# Patient Record
Sex: Female | Born: 1945 | Race: Black or African American | Hispanic: No | Smoking: Current some day smoker
Health system: Southern US, Community
[De-identification: ages and names within clinical notes are randomized; demographics above are authoritative.]

## PROBLEM LIST (undated history)

## (undated) DIAGNOSIS — I10 Essential (primary) hypertension: Secondary | ICD-10-CM

## (undated) DIAGNOSIS — K621 Rectal polyp: Secondary | ICD-10-CM

## (undated) DIAGNOSIS — K635 Polyp of colon: Secondary | ICD-10-CM

## (undated) DIAGNOSIS — E039 Hypothyroidism, unspecified: Secondary | ICD-10-CM

## (undated) HISTORY — PX: OTHER SURGICAL HISTORY: SHX169

## (undated) HISTORY — PX: LUNG SURGERY: SHX703

## (undated) SURGERY — COLONOSCOPY
Anesthesia: Moderate Sedation

---

## 1998-07-30 ENCOUNTER — Encounter: Admission: RE | Admit: 1998-07-30 | Discharge: 1998-07-30 | Payer: Self-pay | Admitting: Family Medicine

## 1998-08-29 ENCOUNTER — Encounter: Admission: RE | Admit: 1998-08-29 | Discharge: 1998-08-29 | Payer: Self-pay | Admitting: Family Medicine

## 1999-11-27 ENCOUNTER — Encounter: Admission: RE | Admit: 1999-11-27 | Discharge: 1999-11-27 | Payer: Self-pay | Admitting: Sports Medicine

## 2000-01-04 ENCOUNTER — Encounter: Admission: RE | Admit: 2000-01-04 | Discharge: 2000-01-04 | Payer: Self-pay | Admitting: *Deleted

## 2000-01-04 ENCOUNTER — Encounter: Payer: Self-pay | Admitting: Sports Medicine

## 2000-04-01 ENCOUNTER — Emergency Department (HOSPITAL_COMMUNITY): Admission: EM | Admit: 2000-04-01 | Discharge: 2000-04-01 | Payer: Self-pay | Admitting: Emergency Medicine

## 2000-05-08 ENCOUNTER — Emergency Department (HOSPITAL_COMMUNITY): Admission: EM | Admit: 2000-05-08 | Discharge: 2000-05-08 | Payer: Self-pay | Admitting: Emergency Medicine

## 2000-05-08 ENCOUNTER — Encounter: Payer: Self-pay | Admitting: Emergency Medicine

## 2000-05-09 ENCOUNTER — Encounter: Payer: Self-pay | Admitting: Emergency Medicine

## 2000-05-12 ENCOUNTER — Encounter (INDEPENDENT_AMBULATORY_CARE_PROVIDER_SITE_OTHER): Payer: Self-pay | Admitting: *Deleted

## 2000-05-12 ENCOUNTER — Ambulatory Visit (HOSPITAL_COMMUNITY): Admission: RE | Admit: 2000-05-12 | Discharge: 2000-05-13 | Payer: Self-pay | Admitting: General Surgery

## 2000-05-12 ENCOUNTER — Encounter: Payer: Self-pay | Admitting: General Surgery

## 2000-08-10 ENCOUNTER — Encounter: Admission: RE | Admit: 2000-08-10 | Discharge: 2000-08-10 | Payer: Self-pay | Admitting: Family Medicine

## 2001-01-25 ENCOUNTER — Encounter: Admission: RE | Admit: 2001-01-25 | Discharge: 2001-01-25 | Payer: Self-pay | Admitting: Family Medicine

## 2001-02-01 ENCOUNTER — Encounter: Admission: RE | Admit: 2001-02-01 | Discharge: 2001-02-01 | Payer: Self-pay | Admitting: Sports Medicine

## 2001-02-01 ENCOUNTER — Encounter: Payer: Self-pay | Admitting: Sports Medicine

## 2001-02-23 ENCOUNTER — Encounter: Admission: RE | Admit: 2001-02-23 | Discharge: 2001-02-23 | Payer: Self-pay | Admitting: Family Medicine

## 2001-03-02 ENCOUNTER — Encounter: Admission: RE | Admit: 2001-03-02 | Discharge: 2001-03-02 | Payer: Self-pay | Admitting: Otolaryngology

## 2001-03-02 ENCOUNTER — Encounter: Payer: Self-pay | Admitting: Otolaryngology

## 2001-03-24 ENCOUNTER — Encounter (INDEPENDENT_AMBULATORY_CARE_PROVIDER_SITE_OTHER): Payer: Self-pay | Admitting: *Deleted

## 2001-03-24 ENCOUNTER — Ambulatory Visit (HOSPITAL_BASED_OUTPATIENT_CLINIC_OR_DEPARTMENT_OTHER): Admission: RE | Admit: 2001-03-24 | Discharge: 2001-03-25 | Payer: Self-pay | Admitting: Otolaryngology

## 2001-06-07 ENCOUNTER — Ambulatory Visit (HOSPITAL_COMMUNITY): Admission: RE | Admit: 2001-06-07 | Discharge: 2001-06-07 | Payer: Self-pay | Admitting: Gastroenterology

## 2001-08-30 ENCOUNTER — Encounter: Admission: RE | Admit: 2001-08-30 | Discharge: 2001-08-30 | Payer: Self-pay | Admitting: Family Medicine

## 2001-09-06 ENCOUNTER — Encounter: Payer: Self-pay | Admitting: Sports Medicine

## 2001-09-06 ENCOUNTER — Encounter: Admission: RE | Admit: 2001-09-06 | Discharge: 2001-09-06 | Payer: Self-pay | Admitting: Sports Medicine

## 2001-09-29 ENCOUNTER — Encounter: Admission: RE | Admit: 2001-09-29 | Discharge: 2001-09-29 | Payer: Self-pay | Admitting: Family Medicine

## 2002-03-08 ENCOUNTER — Encounter: Admission: RE | Admit: 2002-03-08 | Discharge: 2002-03-08 | Payer: Self-pay | Admitting: Family Medicine

## 2002-03-20 ENCOUNTER — Ambulatory Visit (HOSPITAL_COMMUNITY): Admission: RE | Admit: 2002-03-20 | Discharge: 2002-03-20 | Payer: Self-pay | Admitting: Gastroenterology

## 2002-04-23 ENCOUNTER — Encounter: Admission: RE | Admit: 2002-04-23 | Discharge: 2002-04-23 | Payer: Self-pay | Admitting: Family Medicine

## 2002-05-02 ENCOUNTER — Encounter: Payer: Self-pay | Admitting: Sports Medicine

## 2002-05-02 ENCOUNTER — Encounter: Admission: RE | Admit: 2002-05-02 | Discharge: 2002-05-02 | Payer: Self-pay | Admitting: Sports Medicine

## 2002-08-13 ENCOUNTER — Encounter: Admission: RE | Admit: 2002-08-13 | Discharge: 2002-08-13 | Payer: Self-pay | Admitting: Family Medicine

## 2002-12-06 ENCOUNTER — Encounter (INDEPENDENT_AMBULATORY_CARE_PROVIDER_SITE_OTHER): Payer: Self-pay | Admitting: *Deleted

## 2002-12-18 ENCOUNTER — Encounter: Admission: RE | Admit: 2002-12-18 | Discharge: 2002-12-18 | Payer: Self-pay | Admitting: Family Medicine

## 2003-01-22 ENCOUNTER — Encounter: Admission: RE | Admit: 2003-01-22 | Discharge: 2003-01-22 | Payer: Self-pay | Admitting: Sports Medicine

## 2003-03-01 ENCOUNTER — Encounter: Payer: Self-pay | Admitting: Occupational Medicine

## 2003-03-01 ENCOUNTER — Encounter: Admission: RE | Admit: 2003-03-01 | Discharge: 2003-03-01 | Payer: Self-pay | Admitting: Occupational Medicine

## 2003-05-13 ENCOUNTER — Encounter: Admission: RE | Admit: 2003-05-13 | Discharge: 2003-05-13 | Payer: Self-pay | Admitting: Sports Medicine

## 2003-06-17 ENCOUNTER — Ambulatory Visit (HOSPITAL_COMMUNITY): Admission: RE | Admit: 2003-06-17 | Discharge: 2003-06-17 | Payer: Self-pay | Admitting: Family Medicine

## 2004-06-17 ENCOUNTER — Ambulatory Visit (HOSPITAL_COMMUNITY): Admission: RE | Admit: 2004-06-17 | Discharge: 2004-06-17 | Payer: Self-pay | Admitting: Gynecology

## 2004-08-03 ENCOUNTER — Other Ambulatory Visit: Admission: RE | Admit: 2004-08-03 | Discharge: 2004-08-03 | Payer: Self-pay | Admitting: Gynecology

## 2004-11-06 ENCOUNTER — Emergency Department (HOSPITAL_COMMUNITY): Admission: EM | Admit: 2004-11-06 | Discharge: 2004-11-06 | Payer: Self-pay | Admitting: Emergency Medicine

## 2004-11-16 ENCOUNTER — Ambulatory Visit: Payer: Self-pay | Admitting: Family Medicine

## 2005-02-04 ENCOUNTER — Encounter: Admission: RE | Admit: 2005-02-04 | Discharge: 2005-02-04 | Payer: Self-pay | Admitting: Sports Medicine

## 2005-02-04 ENCOUNTER — Ambulatory Visit: Payer: Self-pay | Admitting: Family Medicine

## 2005-03-08 ENCOUNTER — Emergency Department (HOSPITAL_COMMUNITY): Admission: EM | Admit: 2005-03-08 | Discharge: 2005-03-08 | Payer: Self-pay | Admitting: Emergency Medicine

## 2005-03-11 ENCOUNTER — Ambulatory Visit: Payer: Self-pay | Admitting: Sports Medicine

## 2005-05-26 ENCOUNTER — Ambulatory Visit: Payer: Self-pay | Admitting: Family Medicine

## 2005-06-07 ENCOUNTER — Ambulatory Visit: Payer: Self-pay | Admitting: Family Medicine

## 2005-07-05 ENCOUNTER — Ambulatory Visit: Payer: Self-pay | Admitting: Family Medicine

## 2005-07-19 ENCOUNTER — Other Ambulatory Visit: Admission: RE | Admit: 2005-07-19 | Discharge: 2005-07-19 | Payer: Self-pay | Admitting: Gynecology

## 2005-07-19 ENCOUNTER — Ambulatory Visit (HOSPITAL_COMMUNITY): Admission: RE | Admit: 2005-07-19 | Discharge: 2005-07-19 | Payer: Self-pay | Admitting: Women's Health

## 2005-08-30 ENCOUNTER — Encounter: Admission: RE | Admit: 2005-08-30 | Discharge: 2005-08-30 | Payer: Self-pay | Admitting: Gynecology

## 2005-12-31 ENCOUNTER — Ambulatory Visit: Payer: Self-pay | Admitting: Sports Medicine

## 2006-02-10 ENCOUNTER — Emergency Department (HOSPITAL_COMMUNITY): Admission: EM | Admit: 2006-02-10 | Discharge: 2006-02-10 | Payer: Self-pay | Admitting: Family Medicine

## 2006-05-13 ENCOUNTER — Emergency Department (HOSPITAL_COMMUNITY): Admission: EM | Admit: 2006-05-13 | Discharge: 2006-05-13 | Payer: Self-pay | Admitting: Emergency Medicine

## 2007-02-02 DIAGNOSIS — F17200 Nicotine dependence, unspecified, uncomplicated: Secondary | ICD-10-CM

## 2007-02-02 DIAGNOSIS — J4489 Other specified chronic obstructive pulmonary disease: Secondary | ICD-10-CM | POA: Insufficient documentation

## 2007-02-02 DIAGNOSIS — J449 Chronic obstructive pulmonary disease, unspecified: Secondary | ICD-10-CM

## 2007-02-02 DIAGNOSIS — H269 Unspecified cataract: Secondary | ICD-10-CM

## 2007-02-02 DIAGNOSIS — N951 Menopausal and female climacteric states: Secondary | ICD-10-CM

## 2007-02-02 DIAGNOSIS — G43909 Migraine, unspecified, not intractable, without status migrainosus: Secondary | ICD-10-CM | POA: Insufficient documentation

## 2007-02-02 DIAGNOSIS — E039 Hypothyroidism, unspecified: Secondary | ICD-10-CM

## 2007-02-02 DIAGNOSIS — H409 Unspecified glaucoma: Secondary | ICD-10-CM | POA: Insufficient documentation

## 2007-02-02 DIAGNOSIS — N393 Stress incontinence (female) (male): Secondary | ICD-10-CM | POA: Insufficient documentation

## 2007-02-02 DIAGNOSIS — K921 Melena: Secondary | ICD-10-CM | POA: Insufficient documentation

## 2007-02-03 ENCOUNTER — Encounter (INDEPENDENT_AMBULATORY_CARE_PROVIDER_SITE_OTHER): Payer: Self-pay | Admitting: *Deleted

## 2007-07-31 ENCOUNTER — Ambulatory Visit: Payer: Self-pay | Admitting: Internal Medicine

## 2007-07-31 ENCOUNTER — Encounter (INDEPENDENT_AMBULATORY_CARE_PROVIDER_SITE_OTHER): Payer: Self-pay | Admitting: Nurse Practitioner

## 2007-07-31 ENCOUNTER — Ambulatory Visit: Payer: Self-pay | Admitting: *Deleted

## 2007-07-31 DIAGNOSIS — M7150 Other bursitis, not elsewhere classified, unspecified site: Secondary | ICD-10-CM

## 2007-07-31 DIAGNOSIS — J309 Allergic rhinitis, unspecified: Secondary | ICD-10-CM | POA: Insufficient documentation

## 2007-08-03 ENCOUNTER — Ambulatory Visit (HOSPITAL_COMMUNITY): Admission: RE | Admit: 2007-08-03 | Discharge: 2007-08-03 | Payer: Self-pay | Admitting: Internal Medicine

## 2007-08-08 ENCOUNTER — Encounter (INDEPENDENT_AMBULATORY_CARE_PROVIDER_SITE_OTHER): Payer: Self-pay | Admitting: Nurse Practitioner

## 2007-08-08 ENCOUNTER — Encounter: Payer: Self-pay | Admitting: Family Medicine

## 2007-08-08 ENCOUNTER — Ambulatory Visit: Payer: Self-pay | Admitting: Internal Medicine

## 2007-08-08 LAB — CONVERTED CEMR LAB
AST: 12 units/L (ref 0–37)
Alkaline Phosphatase: 114 units/L (ref 39–117)
BUN: 12 mg/dL (ref 6–23)
Basophils Absolute: 0 10*3/uL (ref 0.0–0.1)
Calcium: 9.2 mg/dL (ref 8.4–10.5)
Eosinophils Relative: 4 % (ref 0–5)
Glucose, Urine, Semiquant: NEGATIVE
HCT: 39 % (ref 36.0–46.0)
Lymphocytes Relative: 37 % (ref 12–46)
MCHC: 31.8 g/dL (ref 30.0–36.0)
MCV: 88.6 fL (ref 78.0–100.0)
Monocytes Absolute: 0.7 10*3/uL (ref 0.2–0.7)
Monocytes Relative: 9 % (ref 3–11)
Neutrophils Relative %: 50 % (ref 43–77)
Nitrite: NEGATIVE
Platelets: 308 10*3/uL (ref 150–400)
Protein, U semiquant: NEGATIVE
RBC: 4.4 M/uL (ref 3.87–5.11)
RDW: 15.8 % — ABNORMAL HIGH (ref 11.5–14.0)
Total Bilirubin: 0.3 mg/dL (ref 0.3–1.2)
Total CHOL/HDL Ratio: 5.3
VLDL: 65 mg/dL — ABNORMAL HIGH (ref 0–40)
WBC: 7.5 10*3/uL (ref 4.0–10.5)
pH: 6.5

## 2007-08-10 ENCOUNTER — Encounter: Payer: Self-pay | Admitting: Family Medicine

## 2007-08-15 ENCOUNTER — Ambulatory Visit: Payer: Self-pay | Admitting: Family Medicine

## 2007-08-15 DIAGNOSIS — M19019 Primary osteoarthritis, unspecified shoulder: Secondary | ICD-10-CM | POA: Insufficient documentation

## 2007-09-28 ENCOUNTER — Ambulatory Visit: Payer: Self-pay | Admitting: Family Medicine

## 2007-09-28 LAB — CONVERTED CEMR LAB: LDL Goal: 130 mg/dL

## 2007-11-14 ENCOUNTER — Ambulatory Visit: Payer: Self-pay | Admitting: Family Medicine

## 2007-11-14 LAB — CONVERTED CEMR LAB
Glucose, Urine, Semiquant: NEGATIVE
Ketones, urine, test strip: NEGATIVE
Nitrite: NEGATIVE
Protein, U semiquant: NEGATIVE
Specific Gravity, Urine: 1.02
Urobilinogen, UA: NEGATIVE
WBC Urine, dipstick: NEGATIVE

## 2007-11-23 ENCOUNTER — Ambulatory Visit (HOSPITAL_COMMUNITY): Admission: RE | Admit: 2007-11-23 | Discharge: 2007-11-23 | Payer: Self-pay | Admitting: Family Medicine

## 2007-12-27 ENCOUNTER — Encounter (INDEPENDENT_AMBULATORY_CARE_PROVIDER_SITE_OTHER): Payer: Self-pay | Admitting: Nurse Practitioner

## 2008-01-30 ENCOUNTER — Ambulatory Visit: Payer: Self-pay | Admitting: Family Medicine

## 2008-01-30 DIAGNOSIS — I10 Essential (primary) hypertension: Secondary | ICD-10-CM | POA: Insufficient documentation

## 2008-01-30 DIAGNOSIS — M1710 Unilateral primary osteoarthritis, unspecified knee: Secondary | ICD-10-CM

## 2008-05-30 ENCOUNTER — Telehealth (INDEPENDENT_AMBULATORY_CARE_PROVIDER_SITE_OTHER): Payer: Self-pay | Admitting: Nurse Practitioner

## 2008-07-29 ENCOUNTER — Ambulatory Visit: Payer: Self-pay | Admitting: Family Medicine

## 2008-08-18 ENCOUNTER — Emergency Department (HOSPITAL_COMMUNITY): Admission: EM | Admit: 2008-08-18 | Discharge: 2008-08-18 | Payer: Self-pay | Admitting: Emergency Medicine

## 2008-11-27 ENCOUNTER — Telehealth (INDEPENDENT_AMBULATORY_CARE_PROVIDER_SITE_OTHER): Payer: Self-pay | Admitting: Nurse Practitioner

## 2008-11-27 ENCOUNTER — Emergency Department (HOSPITAL_COMMUNITY): Admission: EM | Admit: 2008-11-27 | Discharge: 2008-11-27 | Payer: Self-pay | Admitting: Emergency Medicine

## 2009-01-02 ENCOUNTER — Ambulatory Visit (HOSPITAL_COMMUNITY): Admission: RE | Admit: 2009-01-02 | Discharge: 2009-01-02 | Payer: Self-pay | Admitting: Family Medicine

## 2009-01-09 ENCOUNTER — Encounter: Admission: RE | Admit: 2009-01-09 | Discharge: 2009-01-09 | Payer: Self-pay | Admitting: Family Medicine

## 2009-01-10 ENCOUNTER — Encounter (INDEPENDENT_AMBULATORY_CARE_PROVIDER_SITE_OTHER): Payer: Self-pay | Admitting: Family Medicine

## 2009-01-22 ENCOUNTER — Ambulatory Visit: Payer: Self-pay | Admitting: Family Medicine

## 2009-01-22 DIAGNOSIS — Z8719 Personal history of other diseases of the digestive system: Secondary | ICD-10-CM

## 2009-01-31 ENCOUNTER — Ambulatory Visit: Payer: Self-pay | Admitting: Family Medicine

## 2009-01-31 LAB — CONVERTED CEMR LAB
ALT: 10 units/L (ref 0–35)
AST: 14 units/L (ref 0–37)
Alkaline Phosphatase: 86 units/L (ref 39–117)
Basophils Absolute: 0 10*3/uL (ref 0.0–0.1)
Basophils Relative: 0 % (ref 0–1)
CO2: 29 meq/L (ref 19–32)
Creatinine, Ser: 0.94 mg/dL (ref 0.40–1.20)
Eosinophils Relative: 2 % (ref 0–5)
Glucose, Bld: 87 mg/dL (ref 70–99)
Hemoglobin: 11.9 g/dL — ABNORMAL LOW (ref 12.0–15.0)
MCHC: 31.5 g/dL (ref 30.0–36.0)
Neutro Abs: 2.6 10*3/uL (ref 1.7–7.7)
Neutrophils Relative %: 43 % (ref 43–77)
Potassium: 3.7 meq/L (ref 3.5–5.3)
RBC: 4.34 M/uL (ref 3.87–5.11)
Sodium: 138 meq/L (ref 135–145)
TSH: 4.587 microintl units/mL — ABNORMAL HIGH (ref 0.350–4.50)
Total CHOL/HDL Ratio: 3.9
WBC: 6 10*3/uL (ref 4.0–10.5)

## 2009-04-01 ENCOUNTER — Ambulatory Visit: Payer: Self-pay | Admitting: Family Medicine

## 2009-04-02 ENCOUNTER — Encounter (INDEPENDENT_AMBULATORY_CARE_PROVIDER_SITE_OTHER): Payer: Self-pay | Admitting: Family Medicine

## 2009-04-28 ENCOUNTER — Ambulatory Visit: Payer: Self-pay | Admitting: Family Medicine

## 2009-04-28 DIAGNOSIS — M62838 Other muscle spasm: Secondary | ICD-10-CM

## 2009-05-14 ENCOUNTER — Encounter: Admission: RE | Admit: 2009-05-14 | Discharge: 2009-06-30 | Payer: Self-pay | Admitting: Family Medicine

## 2009-06-05 ENCOUNTER — Encounter (INDEPENDENT_AMBULATORY_CARE_PROVIDER_SITE_OTHER): Payer: Self-pay | Admitting: Internal Medicine

## 2009-06-13 ENCOUNTER — Encounter (INDEPENDENT_AMBULATORY_CARE_PROVIDER_SITE_OTHER): Payer: Self-pay | Admitting: Nurse Practitioner

## 2009-06-16 ENCOUNTER — Ambulatory Visit: Payer: Self-pay | Admitting: Physician Assistant

## 2009-06-16 DIAGNOSIS — M25519 Pain in unspecified shoulder: Secondary | ICD-10-CM

## 2009-06-25 ENCOUNTER — Ambulatory Visit (HOSPITAL_COMMUNITY): Admission: RE | Admit: 2009-06-25 | Discharge: 2009-06-25 | Payer: Self-pay | Admitting: Physician Assistant

## 2009-07-03 ENCOUNTER — Telehealth: Payer: Self-pay | Admitting: Physician Assistant

## 2009-07-04 ENCOUNTER — Ambulatory Visit: Payer: Self-pay | Admitting: Physician Assistant

## 2009-10-03 ENCOUNTER — Ambulatory Visit: Payer: Self-pay | Admitting: Physician Assistant

## 2009-10-03 DIAGNOSIS — B373 Candidiasis of vulva and vagina: Secondary | ICD-10-CM | POA: Insufficient documentation

## 2009-10-03 DIAGNOSIS — A5901 Trichomonal vulvovaginitis: Secondary | ICD-10-CM | POA: Insufficient documentation

## 2009-10-06 ENCOUNTER — Encounter: Payer: Self-pay | Admitting: Physician Assistant

## 2009-10-06 DIAGNOSIS — E559 Vitamin D deficiency, unspecified: Secondary | ICD-10-CM | POA: Insufficient documentation

## 2009-10-09 ENCOUNTER — Encounter: Payer: Self-pay | Admitting: Physician Assistant

## 2009-10-09 LAB — CONVERTED CEMR LAB
AST: 19 units/L (ref 0–37)
Alkaline Phosphatase: 89 units/L (ref 39–117)
CO2: 15 meq/L — ABNORMAL LOW (ref 19–32)
Calcium: 9.1 mg/dL (ref 8.4–10.5)
Chloride: 101 meq/L (ref 96–112)
Creatinine, Ser: 0.98 mg/dL (ref 0.40–1.20)
Glucose, Bld: 93 mg/dL (ref 70–99)
HDL: 57 mg/dL (ref >39)
Potassium: 4.1 meq/L (ref 3.5–5.3)
Sodium: 142 meq/L (ref 135–145)
Total Bilirubin: 0.4 mg/dL (ref 0.3–1.2)
VLDL: 27 mg/dL (ref 0–40)

## 2009-11-03 ENCOUNTER — Encounter: Payer: Self-pay | Admitting: Physician Assistant

## 2010-01-01 ENCOUNTER — Telehealth: Payer: Self-pay | Admitting: Physician Assistant

## 2010-01-22 ENCOUNTER — Ambulatory Visit (HOSPITAL_COMMUNITY): Admission: RE | Admit: 2010-01-22 | Discharge: 2010-01-22 | Payer: Self-pay | Admitting: Internal Medicine

## 2010-02-10 ENCOUNTER — Telehealth: Payer: Self-pay | Admitting: Physician Assistant

## 2010-02-17 ENCOUNTER — Ambulatory Visit: Payer: Self-pay | Admitting: Physician Assistant

## 2010-02-18 ENCOUNTER — Encounter: Payer: Self-pay | Admitting: Physician Assistant

## 2010-02-19 LAB — CONVERTED CEMR LAB
ALT: 13 units/L (ref 0–35)
AST: 19 units/L (ref 0–37)
BUN: 16 mg/dL (ref 6–23)
Chloride: 98 meq/L (ref 96–112)
Creatinine, Ser: 1.02 mg/dL (ref 0.40–1.20)
Glucose, Bld: 91 mg/dL (ref 70–99)
Sodium: 140 meq/L (ref 135–145)
TSH: 12.949 microintl units/mL — ABNORMAL HIGH (ref 0.350–4.500)
Total Bilirubin: 0.3 mg/dL (ref 0.3–1.2)

## 2010-02-24 ENCOUNTER — Telehealth: Payer: Self-pay | Admitting: Physician Assistant

## 2010-04-28 ENCOUNTER — Ambulatory Visit: Payer: Self-pay | Admitting: Physician Assistant

## 2010-04-28 ENCOUNTER — Telehealth: Payer: Self-pay | Admitting: Physician Assistant

## 2010-04-28 LAB — CONVERTED CEMR LAB: TSH: 3.654 microintl units/mL (ref 0.350–4.500)

## 2010-04-29 ENCOUNTER — Encounter: Payer: Self-pay | Admitting: Physician Assistant

## 2010-07-16 ENCOUNTER — Telehealth: Payer: Self-pay | Admitting: Physician Assistant

## 2010-07-23 ENCOUNTER — Ambulatory Visit: Payer: Self-pay | Admitting: Physician Assistant

## 2010-07-24 ENCOUNTER — Encounter (INDEPENDENT_AMBULATORY_CARE_PROVIDER_SITE_OTHER): Payer: Self-pay | Admitting: *Deleted

## 2010-07-26 ENCOUNTER — Telehealth: Payer: Self-pay | Admitting: Physician Assistant

## 2010-07-27 ENCOUNTER — Telehealth: Payer: Self-pay | Admitting: Physician Assistant

## 2010-07-27 LAB — CONVERTED CEMR LAB
ALT: 11 units/L (ref 0–35)
Cholesterol: 246 mg/dL — ABNORMAL HIGH (ref 0–200)
LDL Cholesterol: 173 mg/dL — ABNORMAL HIGH (ref 0–99)
TSH: 4.053 microintl units/mL (ref 0.350–4.500)
Total Bilirubin: 0.5 mg/dL (ref 0.3–1.2)
Total Protein: 7.3 g/dL (ref 6.0–8.3)
Triglycerides: 99 mg/dL (ref <150)
VLDL: 20 mg/dL (ref 0–40)

## 2010-07-29 ENCOUNTER — Telehealth: Payer: Self-pay | Admitting: Physician Assistant

## 2010-10-27 ENCOUNTER — Encounter (INDEPENDENT_AMBULATORY_CARE_PROVIDER_SITE_OTHER): Payer: Self-pay | Admitting: Internal Medicine

## 2010-10-27 ENCOUNTER — Ambulatory Visit: Payer: Self-pay | Admitting: Physician Assistant

## 2010-10-27 LAB — CONVERTED CEMR LAB
AST: 15 units/L (ref 0–37)
Albumin: 4.4 g/dL (ref 3.5–5.2)
HDL: 53 mg/dL (ref >39)
Indirect Bilirubin: 0.2 mg/dL (ref 0.0–0.9)
Total CHOL/HDL Ratio: 3.7
Triglycerides: 120 mg/dL (ref <150)
VLDL: 24 mg/dL (ref 0–40)

## 2010-11-10 ENCOUNTER — Ambulatory Visit: Payer: Self-pay | Admitting: Internal Medicine

## 2010-11-10 DIAGNOSIS — E785 Hyperlipidemia, unspecified: Secondary | ICD-10-CM

## 2010-12-06 HISTORY — PX: BREAST BIOPSY: SHX20

## 2010-12-27 ENCOUNTER — Encounter: Payer: Self-pay | Admitting: Internal Medicine

## 2010-12-27 ENCOUNTER — Encounter: Payer: Self-pay | Admitting: Family Medicine

## 2011-01-05 NOTE — Progress Notes (Signed)
Summary: GI referral  Phone Note Outgoing Call   Summary of Call: Please refer to GI for screening colo. Initial call taken by: Brynda Rim,  Apr 28, 2010 4:27 PM

## 2011-01-05 NOTE — Progress Notes (Signed)
  Phone Note Outgoing Call   Reason for Call: Discuss billing issue Summary of Call: Getting refill requests for Climara patch. When I last saw her, we decided to taper her off the hormone patch.  She should have been off this medicine by now. Please check with patient. Initial call taken by: Brynda Rim,  January 01, 2010 8:35 AM  Follow-up for Phone Call        Left message on answering machine for pt to call back...[name] KitchenMarland KitchenArmenia Shannon  January 01, 2010 9:23 AM   spoke with pt and she said she is on her last one for the month of Jan.....[name] Kitchen pt says she did not ask for a refill from pharmacy.... [name]  January 05, 2010 10:08 AM    Additional Follow-up for Phone Call Additional follow up Details #1::        Notified pharmacy. Additional Follow-up by: Tereso Newcomer PA-C,  January 05, 2010 10:52 AM

## 2011-01-05 NOTE — Progress Notes (Signed)
Summary: Lab f/u  Phone Note Outgoing Call   Summary of Call: Cholesterol is very high. Is she taking the pravastatin?  Thyroid level ok.  Continue same dose of synthroid.  Initial call taken by: Brynda Rim,  July 27, 2010 10:44 PM  Follow-up for Phone Call        Left message with female for Patient to return call. Gaylyn Cheers RN  July 28, 2010 10:31 AM   Pt. made aware of lab results -- states she is out of pravastatin, so she has not been taking it. Dutch Quint RN  July 29, 2010 11:59 AM   Additional Follow-up for Phone Call Additional follow up Details #1::        Restart pravastatin.  Do not stop taking. Rx in basket to fax to her pharmacy. Check FLP and LFTs in 3 months.  Additional Follow-up by: Tereso Newcomer PA-C,  July 30, 2010 5:34 PM    Additional Follow-up for Phone Call Additional follow up Details #2::    Left message on answering machine for pt. to return call.  Dutch Quint RN  July 31, 2010 9:36 AM  Left message with grandson for pt. to return call.  Dutch Quint RN  August 03, 2010 9:53 AM  Advised pt. of provider's instructions and refill.  Lab appt. 10/27/10. Dutch Quint RN  August 03, 2010 12:17 PM   Prescriptions: PRAVASTATIN SODIUM 20 MG TABS (PRAVASTATIN SODIUM) Take 1 tab by mouth at bedtime  #30 x 3   Entered and Authorized by:   Tereso Newcomer PA-C   Signed by:   Tereso Newcomer PA-C on 07/30/2010   Method used:   Printed then faxed to ...       [facility] - [facility] (retail)       [address] [place], [place]  [address]       Ph: [phone]       Fax: [phone]   RxID:   [id number]

## 2011-01-05 NOTE — Letter (Signed)
Summary: *HSN Results Follow up  [facility]  [address] [place], [place] [address]   Phone: [phone]  Fax: [phone]      04/29/2010   CIEARRA B Sanzo [address] [place], [place]  [address]   Dear  Ms. Pattricia Boss Mulhearn,                            ____S.Drinkard,FNP   ____D. Gore,FNP       ____B. McPherson,MD   ____V. Rankins,MD    ____E. Mulberry,MD    ____N. Daphine Deutscher, FNP  ____D. Reche Dixon, MD    ____K. Philipp Deputy, MD    __x__S. Alben Spittle, PA-C     This letter is to inform you that your recent test(s):  _______Pap Smear    ____x___Lab Test     _______X-ray    ____x___ is within acceptable limits  _______ requires a medication change  _______ requires a follow-up lab visit  _______ requires a follow-up visit with your provider   Comments: Thyroid levels are now normal.  Keep taking the Synthroid (Levothyroxine).       _________________________________________________________ If you have any questions, please contact our office                     Sincerely,  Tereso Newcomer PA-C [facility]

## 2011-01-05 NOTE — Progress Notes (Signed)
  Phone Note Outgoing Call   Summary of Call: Needs FLP and LFTs and TSH.   Initial call taken by: Brynda Rim,  July 16, 2010 1:00 PM  Follow-up for Phone Call        PT IS AWARE AND HAS APPT Follow-up by: [name],  July 17, 2010 9:56 AM    Prescriptions: SYNTHROID 25 MCG TABS (LEVOTHYROXINE SODIUM) Take 1 tablet by mouth once a day  #30 x 3   Entered and Authorized by:   Tereso Newcomer PA-C   Signed by:   Tereso Newcomer PA-C on 07/16/2010   Method used:   Faxed to ...       [facility] - [facility] (retail)       [address] [place], [place]  [address]       Ph: [phone]       Fax: [phone]   RxID:   [id number] EFFEXOR XR 75 MG XR24H-CAP (VENLAFAXINE HCL) Take 1 tab by mouth at bedtime  #30 x 6   Entered and Authorized by:   Tereso Newcomer PA-C   Signed by:   Tereso Newcomer PA-C on 07/16/2010   Method used:   Faxed to ...       [facility] - [facility] (retail)       [address] [place], [place]  [address]       Ph: [phone]       Fax: [phone]   RxID:   [id number] PRAVASTATIN SODIUM 20 MG TABS (PRAVASTATIN SODIUM) Take 1 tab by mouth at bedtime  #30 x 3   Entered and Authorized by:   Tereso Newcomer PA-C   Signed by:   Tereso Newcomer PA-C on 07/16/2010   Method used:   Faxed to ...       [facility] - [facility] (retail)       [address] [place], [place]  [address]       Ph: [phone]       Fax: [phone]   RxID:   [id number]

## 2011-01-05 NOTE — Assessment & Plan Note (Signed)
Summary: HOT FLASHES/NIGHT SWEATS///KT   Vital Signs:  Patient profile:   65 year old female Height:      68.5 inches Weight:      160 pounds BMI:     24.06 Temp:     97.4 degrees F oral Pulse rate:   74 / minute Pulse rhythm:   regular Resp:     18 per minute BP sitting:   115 / 75  (left arm) Cuff size:   regular  Vitals Entered By: [name] (February 17, 2010 9:52 AM) CC: Kelli Sandoval says she has been having hot flashes after she stop taking the climara patches.... Kelli Sandoval says she has hot flashes in her legs and back..... Is Patient Diabetic? No Pain Assessment Patient in pain? no       Does patient need assistance? Functional Status Self care Ambulation Normal   Primary Care Provider:  Tereso Newcomer PA-C  CC:  Kelli Sandoval says she has been having hot flashes after she stop taking the climara patches.... Kelli Sandoval says she has hot flashes in her legs and back......  History of Present Illness: Here for hot flashes. Tapered off climara patch. Has had problems since d/c about 2 mos ago. Has symptoms most every day and at bedtime. Has to change clothes.  Hair seems to be somewhat thin as well. She has quit smoking. Husband still smokes. Has back pain and leg pain with her hot flashes.  These symptoms resolved with the climara patch.  They have now come back.  She has tried celebrex without relief of her hot flashes.  + mood swings + sweats   Current Medications (verified): 1)  Oyster Shell Calcium/vitamin D 500-200 Mg-Unit  Tabs (Calcium Carbonate-Vitamin D) .... Take One Tablet Two Times A Day 2)  Allegra 180 Mg Tabs (Fexofenadine Hcl) .... Take 1 Tablet By Mouth Once A Day As Needed Allegies 3)  Celebrex 100 Mg  Caps (Celecoxib) .... Take 1 Tablet By Mouth Once A Day As Needed For Pain With Food. 4)  Tramadol Hcl 50 Mg  Tabs (Tramadol Hcl) .... Take 1-2 Tablets By Mouth Every 6-8 Hours As Needed For Pain Unreleived By Celebrex 5)  Hydrochlorothiazide 25 Mg  Tabs (Hydrochlorothiazide) ....  Take 1 Tab By Mouth Every Morning For High Blood Pressure 6)  Diflucan 150 Mg Tabs (Fluconazole) .... Take 1 By Mouth X 1 7)  Pravastatin Sodium 20 Mg Tabs (Pravastatin Sodium) .... Take 1 Tab By Mouth At Bedtime 8)  Ergocalciferol 50000 Unit Caps (Ergocalciferol) .... Take 1 By Mouth Q Week For 12 Weeks.  Allergies (verified): No Known Drug Allergies  Physical Exam  General:  alert, well-developed, and well-nourished.   Head:  normocephalic and atraumatic.   Neck:  no thyromegaly.   Lungs:  normal breath sounds, no crackles, and no wheezes.   Heart:  normal rate and regular rhythm.   Extremities:  no edema  Neurologic:  alert & oriented X3 and cranial nerves II-XII intact.   Psych:  normally interactive and good eye contact.     Impression & Recommendations:  Problem # 1:  HYPOTHYROIDISM, UNSPECIFIED (ICD-244.9)  needs f/u TSH  Orders: T-TSH ([address]-62130) T-T4, Free [phone])  Problem # 2:  VITAMIN D DEFICIENCY (ICD-268.9)  finishing replacement now check level if ok, will add Vitamin D 400 International Units to her current dose now  Orders: T-Vitamin D (25-Hydroxy) ([id number])  Problem # 3:  MENOPAUSAL SYNDROME (ICD-627.2) long d/w patient I tapered her off Climara patch due to her  risk factors for CAD she has now had resurgence of menopausal symptoms she is willing to try SSRI will try Effexor XR 75 mg at bedtime f/u with me in 4-6 weeks  Problem # 4:  DYSLIPIDEMIA (ICD-272.4)  not fasting check cmet   Her updated medication list for this problem includes:    Pravastatin Sodium 20 Mg Tabs (Pravastatin sodium) .[name] Kitchen... Take 1 tab by mouth at bedtime  Orders: T-Comprehensive Metabolic Panel ([id number])  Complete Medication List: 1)  Oyster Shell Calcium/vitamin D 500-200 Mg-unit Tabs (Calcium carbonate-vitamin d) .... Take one tablet two times a day 2)  Allegra 180 Mg Tabs (Fexofenadine hcl) .... Take 1 tablet by mouth once a day as needed  allegies 3)  Celebrex 100 Mg Caps (Celecoxib) .... Take 1 tablet by mouth once a day as needed for pain with food. 4)  Hydrochlorothiazide 25 Mg Tabs (Hydrochlorothiazide) .... Take 1 tab by mouth every morning for high blood pressure 5)  Diflucan 150 Mg Tabs (Fluconazole) .... Take 1 by mouth x 1 6)  Pravastatin Sodium 20 Mg Tabs (Pravastatin sodium) .... Take 1 tab by mouth at bedtime 7)  Ergocalciferol 50000 Unit Caps (Ergocalciferol) .... Take 1 by mouth q week for 12 weeks. 8)  Effexor Xr 75 Mg Xr24h-cap (Venlafaxine hcl) .... Take 1 tab by mouth at bedtime  Patient Instructions: 1)  Schedule time to come in fasting for fasting lipids or come fasting to next appointment (nothing to eat or drink except water after midnight). 2)  Please schedule a follow-up appointment in 4-6 weeks with Breindel Collier for hot flashes. 3)  Try to increase activity.  Try walking for 20 minutes a day for 4-5 days a week.  Stop if you develop chest pain or significant shortness of breath. 4)  Consider enrolling in a yoga class.  Most of the [facility] have yoga classes. 5)  Do not take Tramadol with the new medicine (Effexor).  You can stop the tramadol. Prescriptions: EFFEXOR XR 75 MG XR24H-CAP (VENLAFAXINE HCL) Take 1 tab by mouth at bedtime  #30 x 3   Entered and Authorized by:   Tereso Newcomer PA-C   Signed by:   Tereso Newcomer PA-C on 02/17/2010   Method used:   Printed then faxed to ...       [facility] - [facility] (retail)       [address] [place], [place]  [address]       Ph: [phone]       Fax: [phone]   RxID:   [id number]

## 2011-01-05 NOTE — Progress Notes (Signed)
Summary: Possible D/C  Phone Note Outgoing Call   Summary of Call: [place], Please contact pt to review no show policy. Any additional no shows pt will be discharged from the practice.(3rd) Initial call taken by: Hassell Halim CMA,  July 29, 2010 9:56 AM  Follow-up for Phone Call        pt is aware Follow-up by: [name],  July 31, 2010 2:27 PM

## 2011-01-05 NOTE — Assessment & Plan Note (Signed)
Summary: FOLLOW UP WITH Emilyn Ruble PA-C FOR BP CHECK//GK   Vital Signs:  Patient profile:   65 year old female Height:      68.5 inches Weight:      161 pounds BMI:     24.21 Temp:     97.9 degrees F oral Pulse rate:   64 / minute Pulse rhythm:   regular Resp:     18 per minute BP sitting:   126 / 65  (left arm) Cuff size:   regular  Vitals Entered By: [name] (Apr 28, 2010 3:11 PM) CC: f/u...pt says her allergies is bothering her...  pt says she has itchy eyes... pt says her nose feels stopped up... Is Patient Diabetic? No Pain Assessment Patient in pain? no       Does patient need assistance? Functional Status Self care Ambulation Normal   Primary Care Provider:  Tereso Newcomer PA-C  CC:  f/u...pt says her allergies is bothering her...  pt says she has itchy eyes... pt says her nose feels stopped up....  History of Present Illness: Here for f/u.  Unfortunately, her ex-husband was recently diagnosed with esophageal cancer.  He has had a feeding tube placed and is ready to start chemotherapy.    She could not get Allegra filled at our pharmacy as we no longer carry it.  Her allergies are acting up and she is having a lot nasal drainage, congestion, itchy eyes.  She is not using flonase.  Hot flashes:  Much better with the Effexor.  Thyroid:  Needs TSH checked.  Health Maint:  Was to get colo with Dr. Corinda Gubler.   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 Take One Tablet Two Times A Day 2)  Allegra 180 Mg Tabs (Fexofenadine Hcl) .... Take 1 Tablet By Mouth Once A Day As Needed Allegies 3)  Celebrex 100 Mg  Caps (Celecoxib) .... Take 1 Tablet By Mouth Once A Day As Needed For Pain With Food. 4)  Hydrochlorothiazide 25 Mg  Tabs (Hydrochlorothiazide) .... Take 1 Tab By Mouth Every Morning For High Blood Pressure 5)  Diflucan 150 Mg Tabs (Fluconazole) .... Take 1 By Mouth X 1 6)  Pravastatin Sodium 20 Mg Tabs (Pravastatin Sodium) .... Take 1 Tab By Mouth At Bedtime 7)  Ergocalciferol 50000 Unit Caps (Ergocalciferol) .... Take 1 By Mouth Q Week For 12 Weeks. 8)  Effexor Xr 75 Mg Xr24h-Cap (Venlafaxine Hcl) .... Take 1 Tab By Mouth At Bedtime 9)  Vitamin D 400 Unit Caps (Cholecalciferol) .... Take  1 Capsule By Mouth Once A Day 10)  Synthroid 25 Mcg Tabs (Levothyroxine Sodium) .... Take 1 Tablet By Mouth Once A Day  Allergies (verified): No Known Drug Allergies  Physical Exam  General:  alert, well-developed, and well-nourished.   Head:  normocephalic and atraumatic.   Neck:  supple and no thyromegaly.   Lungs:  normal breath sounds, no crackles, and no wheezes.   Heart:  normal rate, regular rhythm, and no murmur.   Neurologic:  alert & oriented X3 and cranial nerves II-XII intact.   Psych:  normally interactive and good eye contact.     Impression & Recommendations:  Problem # 1:  PHYSICAL EXAMINATION  (ICD-V70.0) refer to GI for colo  Problem # 2:  MENOPAUSAL SYNDROME (ICD-627.2) much better with the Effexor  Problem # 3:  ESSENTIAL HYPERTENSION, BENIGN (ICD-401.1) controlled  Her updated medication list for this problem includes:    Hydrochlorothiazide 25 Mg Tabs (Hydrochlorothiazide) .[name] Kitchen... Take 1 tab by mouth every morning for high blood pressure  Problem # 4:  HYPOTHYROIDISM, UNSPECIFIED (ICD-244.9)  needs f/u labs  Her updated medication list for this problem includes:    Synthroid 25 Mcg Tabs (Levothyroxine sodium) .[name] Kitchen... Take 1 tablet by mouth once a day  Orders: T-TSH ([id number]) T-T4, Free [phone])  Problem # 5:  DYSLIPIDEMIA (ICD-272.4) schedule FLP  Her updated medication list for this problem includes:    Pravastatin Sodium 20 Mg Tabs (Pravastatin sodium) .[name] Kitchen... Take 1 tab by mouth at bedtime  Problem # 6:  ALLERGIC RHINITIS (ICD-477.9) change to Xyzal as we carry that now  Her updated medication list for this problem includes:    Xyzal 5 Mg Tabs (Levocetirizine dihydrochloride) .[name] Kitchen... Take 1 tablet by mouth once a day as needed for allergies  Complete Medication List: 1)  Oyster Shell Calcium/vitamin D 500-200 Mg-unit Tabs (Calcium carbonate-vitamin d) .... Take one tablet two times a day 2)  Xyzal 5 Mg Tabs (Levocetirizine dihydrochloride) .... Take 1 tablet by mouth once a day as needed for allergies 3)  Celebrex 100 Mg Caps (Celecoxib) .... Take 1 tablet by mouth once a day as needed for pain with food. 4)  Hydrochlorothiazide 25 Mg Tabs (Hydrochlorothiazide) .... Take 1 tab by mouth every morning for high blood pressure 5)  Diflucan 150 Mg Tabs (Fluconazole) .... Take 1 by mouth x 1 6)  Pravastatin Sodium 20 Mg Tabs (Pravastatin sodium) .... Take 1 tab by mouth at bedtime 7)  Ergocalciferol 50000 Unit Caps (Ergocalciferol) .... Take 1 by mouth q week for 12 weeks. 8)  Effexor Xr 75 Mg Xr24h-cap (Venlafaxine hcl) .... Take 1 tab by mouth at bedtime 9)   Vitamin D 400 Unit Caps (Cholecalciferol) .... Take 1 capsule by mouth once a day 10)  Synthroid 25 Mcg Tabs (Levothyroxine sodium) .... Take 1 tablet by mouth once a day  Patient Instructions: 1)  Someone will call you to schedule the colonoscopy. 2)  Please schedule a follow-up appointment in 6 months with Nell Schrack for blood pressure and thyroid. 3)  Schedule fasting labs in the next 2-3 weeks:  Lipids (Dx 272.4) 4)  I faxed a prescription to the [facility]. pharmacy for Xyzal.  This is for your allergies and takes the place of the Allegra.  Call the pharmacy in 2 days to see if it is ready. Prescriptions: XYZAL 5 MG TABS (LEVOCETIRIZINE DIHYDROCHLORIDE) Take 1 tablet by mouth once a day as needed for allergies  #30 x 3   Entered and  Authorized by:   Tereso Newcomer PA-C   Signed by:   Tereso Newcomer PA-C on 04/28/2010   Method used:   Faxed to ...       [facility] - Pharmac (retail)       [address] [place], [place]  [address]       Ph: [phone]       Fax: [phone]   RxID:   [id number]

## 2011-01-05 NOTE — Assessment & Plan Note (Signed)
Summary: 2236 PT//6 MONTH FU///KT   Vital Signs:  Patient profile:   65 year old female Menstrual status:  postmenopausal Weight:      176.19 pounds Pulse rate:   70 / minute Pulse rhythm:   regular Resp:     18 per minute BP sitting:   108 / 70  (left arm) Cuff size:   regular  Vitals Entered By: Hale Drone CMA (November 10, 2010 3:07 PM) CC: 6 month f/u for BP and thyroid. Would like to know the lab results. Also, wants to let you know she has a colonoscopy sched. for 01/05/10@ [facility].  Is Patient Diabetic? No Pain Assessment Patient in pain? no       Does patient need assistance? Functional Status Self care Ambulation Normal     Menstrual Status postmenopausal Last PAP Result  Specimen Adequacy: Satisfactory for evaluation.   Interpretation/Result:Negative for intraepithelial Lesion or Malignancy.   Interpretation/Result:Trichomonas Vaginalis present.       Primary Care Provider:  Tereso Newcomer PA-C  CC:  6 month f/u for BP and thyroid. Would like to know the lab results. Also and wants to let you know she has a colonoscopy sched. for 01/05/10@ [facility]. .  History of Present Illness: 1.  Hyperlipidemia:  controlled with Pravastatin.  Has gained 15 lbs, however, since May--admits to eating too much  2.  Hypothyroidism:  TSH in August fine.  3.  Allergies:  Xyzal working well for pt--symptoms well controlled.  Symptoms generally with pollens and grasses.    4.  Health Maintenance:  To have a colonoscopy soon.    Current Medications (verified): 1)  Oyster Shell Calcium/vitamin D 500-200 Mg-Unit  Tabs (Calcium Carbonate-Vitamin D) .... Take One Tablet Two Times A Day 2)  Xyzal 5 Mg Tabs (Levocetirizine Dihydrochloride) .... Take 1 Tablet By Mouth Once A Day As Needed For Allergies 3)  Celebrex 100 Mg  Caps (Celecoxib) .... Take 1 Tablet By Mouth Once A Day As Needed For Pain With Food. 4)  Hydrochlorothiazide 25 Mg  Tabs (Hydrochlorothiazide) ....  Take 1 Tab By Mouth Every Morning For High Blood Pressure 5)  Diflucan 150 Mg Tabs (Fluconazole) .... Take 1 By Mouth X 1 6)  Pravastatin Sodium 20 Mg Tabs (Pravastatin Sodium) .... Take 1 Tab By Mouth At Bedtime 7)  Ergocalciferol 50000 Unit Caps (Ergocalciferol) .... Take 1 By Mouth Q Week For 12 Weeks. 8)  Effexor Xr 75 Mg Xr24h-Cap (Venlafaxine Hcl) .... Take 1 Tab By Mouth At Bedtime 9)  Vitamin D 400 Unit Caps (Cholecalciferol) .... Take 1 Capsule By Mouth Once A Day 10)  Synthroid 25 Mcg Tabs (Levothyroxine Sodium) .... Take 1 Tablet By Mouth Once A Day  Allergies (verified): No Known Drug Allergies  Physical Exam  Neck:  No thyroid masses, no thyromegaly Lungs:  Normal respiratory effort, chest expands symmetrically. Lungs are clear to auscultation, no crackles or wheezes. Heart:  Normal rate and regular rhythm. S1 and S2 normal without gallop, murmur, click, rub or other extra sounds.  Radial pulses normal and equal   Impression & Recommendations:  Problem # 1:  HYPERLIPIDEMIA (ICD-272.4) Improved when pt. takes Pravastatin. Encouraged watching diet as has gained 15 lbs since May. Her updated medication list for this problem includes:    Pravastatin Sodium 20 Mg Tabs (Pravastatin sodium) .[name] Kitchen... Take 1 tab by mouth at bedtime  Problem # 2:  HYPOTHYROIDISM, UNSPECIFIED (ICD-244.9) Adequately supplemented. Her updated medication list for this problem includes:  Synthroid 25 Mcg Tabs (Levothyroxine sodium) .[name] Kitchen... Take 1 tablet by mouth once a day  Problem # 3:  ALLERGIC RHINITIS (ICD-477.9) Controlled with Xyzal--discussed she could try and stop with first freeze, but if symptoms return, to restart. Her updated medication list for this problem includes:    Xyzal 5 Mg Tabs (Levocetirizine dihydrochloride) .[name] Kitchen... Take 1 tablet by mouth once a day as needed for allergies  Problem # 4:  Preventive Health Care (ICD-V70.0) Flu vaccine Unable to give Pneumovax as we are out at the  moment  Complete Medication List: 1)  Oyster Shell Calcium/vitamin D 500-200 Mg-unit Tabs (Calcium carbonate-vitamin d) .... Take one tablet two times a day 2)  Xyzal 5 Mg Tabs (Levocetirizine dihydrochloride) .... Take 1 tablet by mouth once a day as needed for allergies 3)  Celebrex 100 Mg Caps (Celecoxib) .... Take 1 tablet by mouth once a day as needed for pain with food. 4)  Hydrochlorothiazide 25 Mg Tabs (Hydrochlorothiazide) .... Take 1 tab by mouth every morning for high blood pressure 5)  Diflucan 150 Mg Tabs (Fluconazole) .... Take 1 by mouth x 1 6)  Pravastatin Sodium 20 Mg Tabs (Pravastatin sodium) .... Take 1 tab by mouth at bedtime 7)  Ergocalciferol 50000 Unit Caps (Ergocalciferol) .... Take 1 by mouth q week for 12 weeks. 8)  Effexor Xr 75 Mg Xr24h-cap (Venlafaxine hcl) .... Take 1 tab by mouth at bedtime 9)  Vitamin D 400 Unit Caps (Cholecalciferol) .... Take 1 capsule by mouth once a day 10)  Synthroid 25 Mcg Tabs (Levothyroxine sodium) .... Take 1 tablet by mouth once a day  Other Orders: Flu Vaccine 64yrs + (30865) Admin 1st Vaccine (78469)  Patient Instructions: 1)  Follow up with Dr. Delrae Alfred in 4 months for CPP (or other provider) Prescriptions: SYNTHROID 25 MCG TABS (LEVOTHYROXINE SODIUM) Take 1 tablet by mouth once a day  #30 x 11   Entered and Authorized by:   Julieanne Manson MD   Signed by:   Julieanne Manson MD on 11/10/2010   Method used:   Faxed to ...       [facility] (retail)       [address] [place], [place]  [address]       Ph: [phone]       Fax: [phone]   RxID:   [id number] HYDROCHLOROTHIAZIDE 25 MG  TABS (HYDROCHLOROTHIAZIDE) Take 1 tab by mouth every morning For high blood pressure  #30 x 11   Entered and Authorized by:   Julieanne Manson MD   Signed by:   Julieanne Manson MD on 11/10/2010   Method used:   Faxed to ...       [facility]  (retail)       [address] [place], [place]  [address]       Ph: [phone]       Fax: [phone]   RxID:   [id number] XYZAL 5 MG TABS (LEVOCETIRIZINE DIHYDROCHLORIDE) Take 1 tablet by mouth once a day as needed for allergies  #30 x 11   Entered and Authorized by:   Julieanne Manson MD   Signed by:   Julieanne Manson MD on 11/10/2010   Method used:   Faxed to ...       [facility] (retail)       [address]  [place], [place]  [address]       Ph: [phone]       Fax: [phone]   RxID:   [id number] OYSTER SHELL CALCIUM/VITAMIN D 500-200 MG-UNIT  TABS (CALCIUM CARBONATE-VITAMIN D) take one tablet two times a day  #60 x 11   Entered and Authorized by:   Julieanne Manson MD   Signed by:   Julieanne Manson MD on 11/10/2010   Method used:   Faxed to ...       [facility] (retail)       [address] [place], [place]  [address]       Ph: [phone]       Fax: [phone]   RxID:   [id number] PRAVASTATIN SODIUM 20 MG TABS (PRAVASTATIN SODIUM) Take 1 tab by mouth at bedtime  #30 x 11   Entered and Authorized by:   Julieanne Manson MD   Signed by:   Julieanne Manson MD on 11/10/2010   Method used:   Faxed to ...       [facility] (retail)       [address] [place], [place]  [address]       Ph: [phone]       Fax: [phone]   RxID:   [id number]    Orders Added: 1)  Flu Vaccine 12yrs + [90658] 2)  Admin 1st Vaccine [90471] 3)  Est. Patient Level III [77824]   Immunizations Administered:  Influenza Vaccine # 1:    Vaccine Type: Fluvax 3+    Site: left deltoid    Mfr: GlaxoSmithKline    Dose: 0.5 ml    Route: IM    Given by: Hale Drone CMA    Exp. Date: 06/05/2011    Lot #: [id number]    VIS given: 06/30/10 version given November 10, 2010.  Flu Vaccine Consent Questions:     Do you have a history of severe allergic reactions to this vaccine? no    Any prior history of allergic reactions to egg and/or gelatin? no    Do you have a sensitivity to the preservative Thimersol? no    Do you have a past history of Guillan-Barre Syndrome? no    Do you currently have an acute febrile illness? no    Have you ever had a severe reaction to latex? no    Vaccine information given and explained to patient? yes    Are you currently pregnant? no   Immunizations Administered:  Influenza Vaccine # 1:    Vaccine Type: Fluvax 3+    Site: left deltoid    Mfr: GlaxoSmithKline    Dose: 0.5 ml    Route: IM    Given by: Hale Drone CMA    Exp. Date: 06/05/2011    Lot #: [id number]    VIS given: 06/30/10 version given November 10, 2010.

## 2011-01-05 NOTE — Progress Notes (Signed)
Summary: NEEDS HER PATCHES FOR HOT FLASHES  Phone Note Call from Patient Call back at Home Phone [phone]   Summary of Call: Kelli Sandoval PT. MS Pontarelli SAYS THAT EVERYSINCE YOU TOOK HER OFF HER PATCHES FOR HOT FLASHES, SHE HASN'T BEEN ABLE TO COPE WITH IT. EVERY NIGHT SHE HAS BEEN SWEATING REAL BAD AND CAN'T REST WELL. SHE IS IN AND OUT FROM UNDER THE COVER EVERYNIGHT. Initial call taken by: Leodis Rains,  February 10, 2010 10:54 AM  Follow-up for Phone Call        spoke with pt and she says she has been out of meds a couple of months now and the hot flashes started at the end of Jan.... pt says when she got winged off the med and then d/c the med because the med cause cancer... pt says she doesn't same med just something for hot flashes...[name] KitchenMarland Kitchen NKDA.... [facility]... Follow-up by: [name],  February 10, 2010 3:16 PM  Additional Follow-up for Phone Call Additional follow up Details #1::        There are a few options. Would be better to see her for an appt to discuss. Schedule appt. Additional Follow-up by: Tereso Newcomer PA-C,  February 11, 2010 1:24 PM    Additional Follow-up for Phone Call Additional follow up Details #2::    SPOKE WITH MS Reckart AND TOLD HER THAT [facility]omasik WANTS HER TO MAKE AN APPOINTMENT, AND SHE IS SCHEDULED TO SEE [facility] ON 03/11 Follow-up by: Leodis Rains,  February 12, 2010 3:16 PM

## 2011-01-05 NOTE — Letter (Signed)
Summary: Referral - not able to see patient  [facility]  [address] [place], [place] [address]   Phone: [phone]  Fax: [phone]    July 24, 2010    Tereso Newcomer, PA-C [address] [facility] [address] [place], [place] [address]   Re:   Kelli Sandoval DOB:  13-Apr-1946 MRN:   [id number]    Dear Dr. Alben Spittle:  Thank you for your kind referral of the above patient.  We have attempted to schedule the recommended procedure Screening Colonoscopy but have not been able to schedule because:  ___ The patient was not available by phone and/or has not returned our calls.   X  The patient declined to schedule the procedure at this time.  We appreciate the referral and hope that we will have the opportunity to treat this patient in the future.    Sincerely,    [facility] [phone]

## 2011-01-05 NOTE — Progress Notes (Signed)
Summary: colonoscopy  Phone Note Outgoing Call   Summary of Call: Kelli Sandoval declined to schedule colonoscopy. See if we can get her in with Dr. Doreatha Martin and see if she will schedule. Tereso Newcomer PA-C  July 26, 2010 10:00 PM   Follow-up for Phone Call        spoke with pt and she would like to go to either [place] or [place] Follow-up by: [name],  July 27, 2010 10:27 AM

## 2011-01-05 NOTE — Progress Notes (Signed)
  Phone Note Call from Patient   Summary of Call: pt needs a refill HCTZ.... [facility] Initial call taken by: [name],  February 24, 2010 9:36 AM    Prescriptions: HYDROCHLOROTHIAZIDE 25 MG  TABS (HYDROCHLOROTHIAZIDE) Take 1 tab by mouth every morning For high blood pressure  #30 x 5   Entered and Authorized by:   Tereso Newcomer PA-C   Signed by:   Tereso Newcomer PA-C on 02/24/2010   Method used:   Faxed to ...       [facility] (retail)       [address] [place], [place]  [address]       Ph: [phone]       Fax: [phone]   RxID:   [id number]

## 2011-01-06 ENCOUNTER — Encounter: Payer: Self-pay | Admitting: Internal Medicine

## 2011-01-06 DIAGNOSIS — K5289 Other specified noninfective gastroenteritis and colitis: Secondary | ICD-10-CM | POA: Insufficient documentation

## 2011-01-13 NOTE — Assessment & Plan Note (Signed)
Summary: fever, chills, and diarrhea   Vital Signs:  Patient profile:   65 year old female Menstrual status:  postmenopausal Weight:      174.44 pounds Temp:     99.4 degrees F oral Pulse rate:   86 / minute Pulse rhythm:   regular Resp:     24 per minute BP sitting:   118 / 78  (left arm) Cuff size:   regular  Vitals Entered By: Hale Drone CMA (January 06, 2011 3:51 PM) CC: Not feeling well since today in the morning around 5am. Vomiting, diarrhea, feeling nauseas when she sees food. Abd is sore from the vomiting. Took a laxative yesterday so her stool wouln't be so hard. Has not taken any meds today b/c she is afaid to vomit them. Is Patient Diabetic? No Pain Assessment Patient in pain? no       Does patient need assistance? Functional Status Self care   Primary Care Provider:  Tereso Newcomer PA-C  CC:  Not feeling well since today in the morning around 5am. Vomiting, diarrhea, and feeling nauseas when she sees food. Abd is sore from the vomiting. Took a laxative yesterday so her stool wouln't be so hard. Has not taken any meds today b/c she is afaid to vomit them..  History of Present Illness: As above Pt. states she actually started feeling poorly yesterday--vomiting and large stool as well.  Has vomited so many times today, she cannot count.  Has had other family members ill with same symptoms.  No definite fever, but has felt hot and cold.  Vomting up yellow fluid.  No abdominal pain save for where she is sore from repeated vomiting.  Last emesis was about 1 1/2 ago.  Took a Correctol yesterday as her stools were hard and her last BM was 6 days earlier.  States she had eaten too much starchy food to set it off.  Had a large watery stool yesterday.  Only one watery stool today.   Has not been able to keep anything down today, including fluids.   Has urinated twice today.  Urine is dark.    Current Medications (verified): 1)  Oyster Shell Calcium/vitamin D 500-200 Mg-Unit   Tabs (Calcium Carbonate-Vitamin D) .... Take One Tablet Two Times A Day 2)  Xyzal 5 Mg Tabs (Levocetirizine Dihydrochloride) .... Take 1 Tablet By Mouth Once A Day As Needed For Allergies 3)  Celebrex 100 Mg  Caps (Celecoxib) .... Take 1 Tablet By Mouth Once A Day As Needed For Pain With Food. 4)  Hydrochlorothiazide 25 Mg  Tabs (Hydrochlorothiazide) .... Take 1 Tab By Mouth Every Morning For High Blood Pressure 5)  Diflucan 150 Mg Tabs (Fluconazole) .... Take 1 By Mouth X 1 6)  Pravastatin Sodium 20 Mg Tabs (Pravastatin Sodium) .... Take 1 Tab By Mouth At Bedtime 7)  Ergocalciferol 50000 Unit Caps (Ergocalciferol) .... Take 1 By Mouth Q Week For 12 Weeks. 8)  Effexor Xr 75 Mg Xr24h-Cap (Venlafaxine Hcl) .... Take 1 Tab By Mouth At Bedtime 9)  Vitamin D 400 Unit Caps (Cholecalciferol) .... Take 1 Capsule By Mouth Once A Day 10)  Synthroid 25 Mcg Tabs (Levothyroxine Sodium) .... Take 1 Tablet By Mouth Once A Day  Allergies (verified): No Known Drug Allergies  Physical Exam  General:  NAD Eyes:  PERRL, EOMI Ears:  External ear exam shows no significant lesions or deformities.  Otoscopic examination reveals clear canals, tympanic membranes are intact bilaterally without bulging, retraction, inflammation or  discharge. Hearing is grossly normal bilaterally. Mouth:  MMM, throat without injection. Neck:  No deformities, masses, or tenderness noted. Lungs:  Normal respiratory effort, chest expands symmetrically. Lungs are clear to auscultation, no crackles or wheezes. Heart:  Normal rate and regular rhythm. S1 and S2 normal without gallop, murmur, click, rub or other extra sounds. Abdomen:  Bowel sounds positive,abdomen soft and non-tender without masses, organomegaly or hernias noted.   Impression & Recommendations:  Problem # 1:  GASTROENTERITIS (ICD-558.9) See patient info Pt. instructed to call if unable to keep anything down the next 24 hours  Complete Medication List: 1)  Oyster Shell  Calcium/vitamin D 500-200 Mg-unit Tabs (Calcium carbonate-vitamin d) .... Take one tablet two times a day 2)  Xyzal 5 Mg Tabs (Levocetirizine dihydrochloride) .... Take 1 tablet by mouth once a day as needed for allergies 3)  Celebrex 100 Mg Caps (Celecoxib) .... Take 1 tablet by mouth once a day as needed for pain with food. 4)  Hydrochlorothiazide 25 Mg Tabs (Hydrochlorothiazide) .... Take 1 tab by mouth every morning for high blood pressure 5)  Diflucan 150 Mg Tabs (Fluconazole) .... Take 1 by mouth x 1 6)  Pravastatin Sodium 20 Mg Tabs (Pravastatin sodium) .... Take 1 tab by mouth at bedtime 7)  Ergocalciferol 50000 Unit Caps (Ergocalciferol) .... Take 1 by mouth q week for 12 weeks. 8)  Effexor Xr 75 Mg Xr24h-cap (Venlafaxine hcl) .... Take 1 tab by mouth at bedtime 9)  Vitamin D 400 Unit Caps (Cholecalciferol) .... Take 1 capsule by mouth once a day 10)  Synthroid 25 Mcg Tabs (Levothyroxine sodium) .... Take 1 tablet by mouth once a day 11)  Promethazine Hcl 25 Mg Supp (Promethazine hcl) .[name] Kitchen.. 1 suppository per rectum every 6 hours as needed for nausea and vomting  Patient Instructions: 1)  Small amounts of clear liquids frequently 2)  When you get home--use a promethazine suppository--wait 15 minutes, then start to drink as above 3)  Once you have kept the fluids down for 12 hours, you may gradually start bland solid foods as tolerated. 4)  If you vomit again, start the sipping of fluids all over.  Prescriptions: PROMETHAZINE HCL 25 MG SUPP (PROMETHAZINE HCL) 1 suppository per rectum every 6 hours as needed for nausea and vomting  #12 x 0   Entered and Authorized by:   Julieanne Manson MD   Signed by:   Julieanne Manson MD on 01/06/2011   Method used:   Electronically to        [facility] [phone]* (retail)       [address]       [place], [place]  [address]       Ph: [phone]       Fax: [phone]   RxID:   [id number]    Orders Added: 1)  Est. Patient  Level III [44034]

## 2011-04-23 NOTE — Discharge Summary (Signed)
[place]  Patient:    Kelli Sandoval, Kelli Sandoval                    MRN: [id number] Adm. Date:  05/12/00 Disc. Date: 05/13/00 Attending:  Jimmye Norman, M.D.                           Discharge Summary  DISCHARGE DIAGNOSIS:  Symptomatic cholelithiasis and subacute cholecystitis.  DISCHARGE MEDICATIONS:  Vicodin 1-2 tablets every four hours p.r.n. for pain.  FOLLOW-UP:  To see me in two weeks.  DIET:  As tolerated.  ACTIVITY:  Ambulate as tolerated.  WOUND CARE:  She may remove outer dressings in two days, shower, pat wounds dry.  HISTORY OF PRESENT ILLNESS AND HOSPITAL COURSE:  The patient is 22, status post laparoscopic cholecystectomy for symptomatic cholelithiasis.  Admitted and observed overnight.  Her operation was eventful only for perforation of the gallbladder during the procedure with spillage of a lot of small stones, which were aspirated out of the abdomen.  She is, otherwise, doing well.  On the day of discharge she had good bowel sounds, minimal abdominal pain, slightly bloated, eating well, tolerating a diet well, and afebrile.  She was discharged home to the care of her family.  She will return to see me in two weeks for follow-up. DD:  05/13/00 TD:  05/18/00 Job: [id number]

## 2011-04-23 NOTE — Op Note (Signed)
[facility]  Patient:    Kelli Sandoval, Kelli Sandoval                  MRN: [id number] Proc. Date: 03/24/01 Adm. Date:  [id number] Disc. Date: [id number] Attending:  Serena Colonel H CC:         Jacobo Forest, M.D.   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  OPERATION:  Sistrunk procedure (excision of thyroglossal duct cyst).  SURGEON:  Jefry H. Pollyann Kennedy, M.D.  ANESTHESIA:  General endotracheal anesthesia was used.  COMPLICATIONS:  No complications.  ESTIMATED BLOOD LOSS:  5 cc.  FINDINGS:  Cystic mass in the anterior neck midline with an attachment to the hyoid bone and extension approximately 1.5 cm superior to the hyoid bone.  The cyst contained thick mucoid material.  SPECIMENS:  Specimens sent to pathology was thyroglossal duct cyst and portion of hyoid bone sent for routine pathological evaluation.  DISPOSITION:  The patient tolerated the procedure well, was awakened, extubated and transferred to the recovery room in stable condition.  INDICATIONS:  This is a 65 year old lady with a seven month history of an anterior neck mass.  Ultrasound revealed findings consistent with thyroglossal duct cyst.  Risks, benefits, complications and alternatives of the procedure were explained to the patient who seemed to understand and agreed to the surgery.  DESCRIPTION OF PROCEDURE:  The patient was taken to the operating room and placed on the operating table in the supine position.  Following induction of general endotracheal anesthesia, the patient was prepped and draped in a standard fashion.  A transverse incision was outlined in the anterior neck at approximately the level of the mid thyroid cartilage.  A subcutaneous scalpel was used to incise the skin and subcutaneous tissue.  Electrocautery was used to dissect  down through the subcutaneous tissue and the fascia.  A branch of the anterior jugular system was tied off with silk ligature.  The strap muscles were reflected laterally.  The cyst was exposed and careful dissection around the cyst was accomplished.  The cyst was tracked up around the thyrohyoid ligament.  During the dissection, the contents of the cyst were exposed and were suctioned out of the surgical field.  The cyst was followed up superiorly to the midportion of the hyoid bone.  The midportion of the hyoid was skeletonized and cut with bone cutting rongeurs.  The cyst was felt to be continued another 1.5 cm or so superior to this and careful dissection was used around this.  There was no remaining cystic tract or sinus tract superior to that area.  The specimen was delivered from the wound and sent for pathologic evaluation.  The wound was irrigated.  Hemostasis was completed and the incision was reapproximated.  Then 4-0 chromic was used to reapproximate the strap muscles and 4-0 chromic suture was also used to reapproximate the platysmal layer.  A rubber band drain was left in the depths of the wound exiting through the center of the incision.  No suture was applied.  The skin was reapproximated with two running 5-0 nylon sutures that were left untied in the midline with the drain lines and secured in place with Steri-Strips.  A dressing was applied, and the patient was then awakened, extubated and transferred to recovery in good condition. DD:  03/24/01 TD:  03/26/01 Job: [id number]

## 2011-04-23 NOTE — Procedures (Signed)
[place]  Patient:    Kelli Sandoval, Kelli Sandoval Visit Number: [id number] MRN: [id number]          Service Type: END Location: ENDO Attending Physician:  Charna Elizabeth Dictated by:   Anselmo Rod, M.D. Proc. Date: 03/20/02 Admit Date:  03/20/2002   CC:         [name], M.D.   Procedure Report  DATE OF BIRTH:  January 30, 1947  PROCEDURE PERFORMED:  Flexible sigmoidoscopy up to 60 cm.  ENDOSCOPIST:  Anselmo Rod, M.D.  INSTRUMENT USED:  Olympus video colonoscope.  INDICATIONS FOR PROCEDURE: The patient is a 64 year old African-American female with a history of polyp diagnosed on flexible sigmoidoscopy done at the [facility] clinic last year.  The patient had a colonoscopy done that does not reveal that polyp. The patient also had a large amount of stool in the colon.  Therefore plans were to repeat flexible sigmoidoscopy to re-evaluate the left colon.  PREPROCEDURE PREPARATION:  Informed consent was procured from the patient. The patient was fasted for eight hours prior to procedure and prepped with two Fleets enemas the morning of the procedure.  PREPROCEDURE PHYSICAL:  Patient had stable vital signs.  Neck supple, chest clear to auscultation.  S1, S2 regular.  Abdomen soft with normal bowel sounds.  DESCRIPTION OF PROCEDURE:  The patient was placed in left lateral decubitus position and sedated with 50 mg of Demerol and 3 mg of Versed intravenously. Once the patient was adequately sedated and maintained on low-flow oxygen and continuous cardiac monitoring, the Olympus video colonoscope was advanced from the rectum to 60 cm without difficulty. There was a large amount of liquid residual stool in the colon that was suctioned out.  No masses or polyps were seen.  There were small internal hemorrhoids and small external hemorrhoid appreciated on retroflexion and anal inspection respectively.  The patient tolerated the  procedure well without complication.  There was no evidence of diverticulosis.  IMPRESSION: 1. Small external hemorrhoid and small nonbleeding internal hemorrhoids. 2. No masses or polyps seen up to 60 cm.  RECOMMENDATIONS: 1. Repeat colorectal cancer screening is recommended in the next five years    unless the patient were to develop any abnormal symptoms in the interim. 2. A high fiber diet has been recommended and outpatient follow-up is advised    on a p.r.n. basis. Dictated by:   Anselmo Rod, M.D. Attending Physician:  Charna Elizabeth DD:  03/20/02 TD:  03/20/02 Job: [id number]

## 2011-06-17 ENCOUNTER — Other Ambulatory Visit (HOSPITAL_COMMUNITY): Payer: Self-pay | Admitting: Family Medicine

## 2011-06-17 DIAGNOSIS — Z1231 Encounter for screening mammogram for malignant neoplasm of breast: Secondary | ICD-10-CM

## 2011-06-25 ENCOUNTER — Ambulatory Visit (HOSPITAL_COMMUNITY)
Admission: RE | Admit: 2011-06-25 | Discharge: 2011-06-25 | Disposition: A | Discharge status: Home / Self Care | Payer: Self-pay | Source: Ambulatory Visit | Attending: Family Medicine | Admitting: Family Medicine

## 2011-06-25 DIAGNOSIS — Z1231 Encounter for screening mammogram for malignant neoplasm of breast: Secondary | ICD-10-CM | POA: Insufficient documentation

## 2011-06-28 ENCOUNTER — Other Ambulatory Visit: Payer: Self-pay | Admitting: Family Medicine

## 2011-06-28 DIAGNOSIS — R928 Other abnormal and inconclusive findings on diagnostic imaging of breast: Secondary | ICD-10-CM

## 2011-07-02 ENCOUNTER — Ambulatory Visit
Admission: RE | Admit: 2011-07-02 | Discharge: 2011-07-02 | Disposition: A | Discharge status: Home / Self Care | Payer: Self-pay | Source: Ambulatory Visit | Attending: Family Medicine | Admitting: Family Medicine

## 2011-07-02 ENCOUNTER — Other Ambulatory Visit: Payer: Self-pay | Admitting: Family Medicine

## 2011-07-02 DIAGNOSIS — N6320 Unspecified lump in the left breast, unspecified quadrant: Secondary | ICD-10-CM

## 2011-07-02 DIAGNOSIS — R928 Other abnormal and inconclusive findings on diagnostic imaging of breast: Secondary | ICD-10-CM

## 2011-07-07 ENCOUNTER — Other Ambulatory Visit: Payer: Self-pay | Admitting: Family Medicine

## 2011-07-07 ENCOUNTER — Ambulatory Visit
Admission: RE | Admit: 2011-07-07 | Discharge: 2011-07-07 | Disposition: A | Discharge status: Home / Self Care | Payer: Self-pay | Source: Ambulatory Visit | Attending: Family Medicine | Admitting: Family Medicine

## 2011-07-07 DIAGNOSIS — N6320 Unspecified lump in the left breast, unspecified quadrant: Secondary | ICD-10-CM

## 2011-09-09 LAB — CBC
HCT: 38.2 % (ref 36.0–46.0)
Hemoglobin: 12.6 g/dL (ref 12.0–15.0)
MCV: 87.9 fL (ref 78.0–100.0)
Platelets: 315 10*3/uL (ref 150–400)
RBC: 4.35 MIL/uL (ref 3.87–5.11)
WBC: 11.9 10*3/uL — ABNORMAL HIGH (ref 4.0–10.5)

## 2011-09-09 LAB — COMPREHENSIVE METABOLIC PANEL
Alkaline Phosphatase: 91 U/L (ref 39–117)
BUN: 10 mg/dL (ref 6–23)
CO2: 22 mEq/L (ref 19–32)
Chloride: 101 mEq/L (ref 96–112)
Creatinine, Ser: 0.84 mg/dL (ref 0.4–1.2)
GFR calc non Af Amer: >60 mL/min (ref >60)
Glucose, Bld: 108 mg/dL — ABNORMAL HIGH (ref 70–99)
Total Bilirubin: 0.8 mg/dL (ref 0.3–1.2)

## 2011-09-09 LAB — DIFFERENTIAL
Basophils Absolute: 0 10*3/uL (ref 0.0–0.1)
Basophils Relative: 0 % (ref 0–1)
Lymphocytes Relative: 4 % — ABNORMAL LOW (ref 12–46)
Neutro Abs: 11 10*3/uL — ABNORMAL HIGH (ref 1.7–7.7)

## 2011-09-10 ENCOUNTER — Other Ambulatory Visit: Payer: Self-pay | Admitting: Family Medicine

## 2011-10-13 ENCOUNTER — Encounter (HOSPITAL_COMMUNITY)
Admission: RE | Disposition: A | Discharge status: Home Health | Payer: Self-pay | Source: Ambulatory Visit | Attending: Gastroenterology

## 2011-10-13 ENCOUNTER — Other Ambulatory Visit: Payer: Self-pay | Admitting: Gastroenterology

## 2011-10-13 ENCOUNTER — Encounter (HOSPITAL_COMMUNITY): Payer: Self-pay | Admitting: *Deleted

## 2011-10-13 ENCOUNTER — Ambulatory Visit (HOSPITAL_COMMUNITY)
Admission: RE | Admit: 2011-10-13 | Discharge: 2011-10-13 | Disposition: A | Discharge status: Home Health | Payer: Self-pay | Source: Ambulatory Visit | Attending: Gastroenterology | Admitting: Gastroenterology

## 2011-10-13 DIAGNOSIS — K648 Other hemorrhoids: Secondary | ICD-10-CM | POA: Insufficient documentation

## 2011-10-13 DIAGNOSIS — D126 Benign neoplasm of colon, unspecified: Secondary | ICD-10-CM | POA: Insufficient documentation

## 2011-10-13 DIAGNOSIS — Z1211 Encounter for screening for malignant neoplasm of colon: Secondary | ICD-10-CM | POA: Insufficient documentation

## 2011-10-13 HISTORY — DX: Hypothyroidism, unspecified: E03.9

## 2011-10-13 HISTORY — DX: Essential (primary) hypertension: I10

## 2011-10-13 HISTORY — PX: COLONOSCOPY: SHX5424

## 2011-10-13 HISTORY — DX: Polyp of colon: K63.5

## 2011-10-13 HISTORY — DX: Rectal polyp: K62.1

## 2011-10-13 SURGERY — COLONOSCOPY
Anesthesia: Moderate Sedation

## 2011-10-13 MED ORDER — MIDAZOLAM HCL 10 MG/2ML IJ SOLN
INTRAMUSCULAR | Status: DC | PRN
  Administered 2011-10-13 (×2): 2 mg via INTRAVENOUS
  Administered 2011-10-13: 1 mg via INTRAVENOUS

## 2011-10-13 MED ORDER — FENTANYL CITRATE 0.05 MG/ML IJ SOLN
INTRAMUSCULAR | Status: AC
  Filled 2011-10-13: qty 4

## 2011-10-13 MED ORDER — FENTANYL CITRATE 0.05 MG/ML IJ SOLN
INTRAMUSCULAR | Status: DC | PRN
  Administered 2011-10-13 (×3): 25 ug via INTRAVENOUS

## 2011-10-13 MED ORDER — MIDAZOLAM HCL 10 MG/2ML IJ SOLN
INTRAMUSCULAR | Status: AC
  Filled 2011-10-13: qty 4

## 2011-10-13 NOTE — Brief Op Note (Signed)
10/13/2011  10:32 AM  PATIENT:  Kelli Sandoval  65 y.o. female  PRE-OPERATIVE DIAGNOSIS:  screening  POST-OPERATIVE DIAGNOSIS:  small internal hemorrhoids, transverse colon polyp  PROCEDURE:  Procedure(s): COLONOSCOPY  SURGEON:  Surgeon(s): Barrie Folk, MD  PHYSICIAN ASSISTANT:      Please see H&P

## 2011-10-13 NOTE — Brief Op Note (Signed)
10/13/2011  9:49 AM  PATIENT:  Kelli Sandoval  65 y.o. female  PRE-OPERATIVE DIAGNOSIS:  screening  POST-OPERATIVE DIAGNOSIS:  small internal hemorrhoids, transverse colon polyp  PROCEDURE:  Procedure(s): COLONOSCOPY  SURGEON:  Surgeon(s): Barrie Folk, MD

## 2011-10-13 NOTE — H&P (Signed)
Please see H&P

## 2011-10-15 ENCOUNTER — Encounter (HOSPITAL_COMMUNITY): Payer: Self-pay

## 2011-10-29 ENCOUNTER — Encounter (HOSPITAL_COMMUNITY): Payer: Self-pay | Admitting: Gastroenterology

## 2012-05-30 ENCOUNTER — Other Ambulatory Visit (HOSPITAL_COMMUNITY): Payer: Self-pay | Admitting: Family Medicine

## 2012-05-30 DIAGNOSIS — Z1231 Encounter for screening mammogram for malignant neoplasm of breast: Secondary | ICD-10-CM

## 2012-06-26 ENCOUNTER — Ambulatory Visit (HOSPITAL_COMMUNITY)
Admission: RE | Admit: 2012-06-26 | Discharge: 2012-06-26 | Disposition: A | Discharge status: Home / Self Care | Payer: Medicare Other | Source: Ambulatory Visit | Attending: Family Medicine | Admitting: Family Medicine

## 2012-06-26 DIAGNOSIS — Z1231 Encounter for screening mammogram for malignant neoplasm of breast: Secondary | ICD-10-CM

## 2012-09-15 ENCOUNTER — Other Ambulatory Visit (HOSPITAL_COMMUNITY)
Admission: RE | Admit: 2012-09-15 | Discharge: 2012-09-15 | Disposition: A | Discharge status: Home / Self Care | Payer: Medicare Other | Source: Ambulatory Visit | Attending: Family Medicine | Admitting: Family Medicine

## 2012-09-15 ENCOUNTER — Other Ambulatory Visit: Payer: Self-pay | Admitting: Family Medicine

## 2012-09-15 DIAGNOSIS — Z1151 Encounter for screening for human papillomavirus (HPV): Secondary | ICD-10-CM | POA: Insufficient documentation

## 2012-09-15 DIAGNOSIS — Z124 Encounter for screening for malignant neoplasm of cervix: Secondary | ICD-10-CM | POA: Insufficient documentation

## 2012-09-15 DIAGNOSIS — R87810 Cervical high risk human papillomavirus (HPV) DNA test positive: Secondary | ICD-10-CM | POA: Insufficient documentation

## 2013-03-12 ENCOUNTER — Emergency Department (INDEPENDENT_AMBULATORY_CARE_PROVIDER_SITE_OTHER)
Admission: EM | Admit: 2013-03-12 | Discharge: 2013-03-12 | Disposition: A | Discharge status: Home / Self Care | Payer: Medicare Other | Source: Home / Self Care | Attending: Family Medicine | Admitting: Family Medicine

## 2013-03-12 ENCOUNTER — Encounter (HOSPITAL_COMMUNITY): Payer: Self-pay | Admitting: Emergency Medicine

## 2013-03-12 DIAGNOSIS — L723 Sebaceous cyst: Secondary | ICD-10-CM

## 2013-03-12 DIAGNOSIS — L089 Local infection of the skin and subcutaneous tissue, unspecified: Secondary | ICD-10-CM

## 2013-03-12 NOTE — ED Notes (Signed)
Evaluated by physician prior to this nurse.  Knot to left shoulder.

## 2013-03-12 NOTE — ED Provider Notes (Signed)
History     CSN: [id number]  Arrival date & time 03/12/13  1503   First MD Initiated Contact with Patient 03/12/13 1542      Chief Complaint  Patient presents with  . Cyst    (Consider location/radiation/quality/duration/timing/severity/associated sxs/prior treatment) Patient is a 67 y.o. female presenting with rash. The history is provided by the patient and a relative.  Rash Location:  Shoulder/arm Shoulder/arm rash location:  L shoulder Quality: redness and swelling   Severity:  Mild Onset quality:  Gradual Duration:  2 months Progression:  Worsening Chronicity:  New Worsened by:  Nothing tried Ineffective treatments:  None tried   Past Medical History  Diagnosis Date  . Hypertension   . Hypothyroidism   . Colorectal polyps   . Allergic rhinitis     Past Surgical History  Procedure Laterality Date  . Allergic rhinitis    . Lung surgery  25 years ago    benign tumor  . Colonoscopy  10/13/2011    Procedure: COLONOSCOPY;  Surgeon: Barrie Folk, MD;  Location: [facility] ENDOSCOPY;  Service: Endoscopy;  Laterality: N/A;    No family history on file.  History  Substance Use Topics  . Smoking status: Current Some Day Smoker    Types: Cigarettes  . Smokeless tobacco: Not on file  . Alcohol Use: No    OB History   Grav Para Term Preterm Abortions TAB SAB Ect Mult Living                  Review of Systems  Constitutional: Negative.   Musculoskeletal: Positive for back pain.  Skin: Positive for rash.    Allergies  Review of patient's allergies indicates no known allergies.  Home Medications   Current Outpatient Rx  Name  Route  Sig  Dispense  Refill  . Loratadine (CLARITIN PO)   Oral   Take by mouth.         . fish oil-omega-3 fatty acids 1000 MG capsule   Oral   Take 1,200 mg by mouth daily.           . hydrochlorothiazide (HYDRODIURIL) 25 MG tablet   Oral   Take 25 mg by mouth daily.           [name] Kitchen levothyroxine (SYNTHROID, LEVOTHROID) 25 MCG  tablet   Oral   Take 25 mcg by mouth daily.           [name] Kitchen venlafaxine (EFFEXOR-XR) 75 MG 24 hr capsule   Oral   Take 75 mg by mouth daily.             BP 127/78  Pulse 73  Temp(Src) 97.8 F (36.6 C) (Oral)  Resp 18  SpO2 96%  Physical Exam  Nursing note and vitals reviewed. Constitutional: She is oriented to person, place, and time. She appears well-developed and well-nourished.  Musculoskeletal: Normal range of motion.  Neurological: She is alert and oriented to person, place, and time.  Skin: Skin is warm and dry. There is erythema.  Cystic tender 2cm lesion left scapular back.    ED Course  INCISION AND DRAINAGE Date/Time: 03/12/2013 5:00 PM Performed by: Linna Hoff Authorized by: Bradd Canary D Consent: Verbal consent obtained. Consent given by: patient Type: cyst Body area: trunk Location details: back Local anesthetic: topical anesthetic Patient sedated: no Scalpel size: 11 Incision type: single straight Complexity: simple Drainage amount: moderate Wound treatment: wound left open Patient tolerance: Patient tolerated the procedure well with no immediate complications.  Comments: Culture obtained.   (including critical care time)  Labs Reviewed  CULTURE, ROUTINE-ABSCESS   No results found.   1. Infected sebaceous cyst       MDM          Linna Hoff, MD 03/12/13 [phone]

## 2013-03-15 LAB — CULTURE, ROUTINE-ABSCESS

## 2013-03-15 NOTE — ED Notes (Addendum)
Abscess culture L scapula: Abundant staph (coagulase neg.).  Pt. treated with I and D.  Message sent to Dr. Artis Flock. Vassie Moselle 03/15/2013 No further action needed per Dr. Artis Flock. Vassie Moselle 03/16/2013

## 2013-07-24 ENCOUNTER — Other Ambulatory Visit (HOSPITAL_COMMUNITY): Payer: Self-pay | Admitting: Family Medicine

## 2013-07-24 DIAGNOSIS — Z1231 Encounter for screening mammogram for malignant neoplasm of breast: Secondary | ICD-10-CM

## 2013-08-03 ENCOUNTER — Ambulatory Visit (HOSPITAL_COMMUNITY)
Admission: RE | Admit: 2013-08-03 | Discharge: 2013-08-03 | Disposition: A | Discharge status: Home / Self Care | Payer: Medicare Other | Source: Ambulatory Visit | Attending: Family Medicine | Admitting: Family Medicine

## 2013-08-03 DIAGNOSIS — Z1231 Encounter for screening mammogram for malignant neoplasm of breast: Secondary | ICD-10-CM | POA: Insufficient documentation

## 2013-12-01 ENCOUNTER — Encounter (HOSPITAL_COMMUNITY): Payer: Self-pay | Admitting: Emergency Medicine

## 2013-12-01 ENCOUNTER — Emergency Department (HOSPITAL_COMMUNITY): Payer: Medicare Other

## 2013-12-01 ENCOUNTER — Emergency Department (HOSPITAL_COMMUNITY)
Admission: EM | Admit: 2013-12-01 | Discharge: 2013-12-01 | Disposition: A | Discharge status: Home / Self Care | Payer: Medicare Other | Attending: Emergency Medicine | Admitting: Emergency Medicine

## 2013-12-01 DIAGNOSIS — I10 Essential (primary) hypertension: Secondary | ICD-10-CM | POA: Insufficient documentation

## 2013-12-01 DIAGNOSIS — Z8709 Personal history of other diseases of the respiratory system: Secondary | ICD-10-CM | POA: Insufficient documentation

## 2013-12-01 DIAGNOSIS — E039 Hypothyroidism, unspecified: Secondary | ICD-10-CM | POA: Insufficient documentation

## 2013-12-01 DIAGNOSIS — X500XXA Overexertion from strenuous movement or load, initial encounter: Secondary | ICD-10-CM | POA: Insufficient documentation

## 2013-12-01 DIAGNOSIS — Y9389 Activity, other specified: Secondary | ICD-10-CM | POA: Insufficient documentation

## 2013-12-01 DIAGNOSIS — Z79899 Other long term (current) drug therapy: Secondary | ICD-10-CM | POA: Insufficient documentation

## 2013-12-01 DIAGNOSIS — Z860100 Personal history of colon polyps, unspecified: Secondary | ICD-10-CM | POA: Insufficient documentation

## 2013-12-01 DIAGNOSIS — IMO0002 Reserved for concepts with insufficient information to code with codable children: Secondary | ICD-10-CM | POA: Insufficient documentation

## 2013-12-01 DIAGNOSIS — F17200 Nicotine dependence, unspecified, uncomplicated: Secondary | ICD-10-CM | POA: Insufficient documentation

## 2013-12-01 DIAGNOSIS — Y929 Unspecified place or not applicable: Secondary | ICD-10-CM | POA: Insufficient documentation

## 2013-12-01 DIAGNOSIS — M5416 Radiculopathy, lumbar region: Secondary | ICD-10-CM

## 2013-12-01 DIAGNOSIS — M545 Low back pain: Secondary | ICD-10-CM

## 2013-12-01 MED ORDER — MORPHINE SULFATE 4 MG/ML IJ SOLN
4.0000 mg | Freq: Once | INTRAMUSCULAR | Status: AC
  Administered 2013-12-01: 4 mg via INTRAMUSCULAR
  Filled 2013-12-01: qty 1

## 2013-12-01 MED ORDER — TRAMADOL HCL 50 MG PO TABS: 50.0000 mg | ORAL_TABLET | Freq: Four times a day (QID) | ORAL | Status: DC | PRN

## 2013-12-01 MED ORDER — ONDANSETRON 4 MG PO TBDP
4.0000 mg | ORAL_TABLET | Freq: Once | ORAL | Status: AC
  Administered 2013-12-01: 4 mg via ORAL
  Filled 2013-12-01: qty 1

## 2013-12-01 NOTE — ED Provider Notes (Signed)
CSN: [id number]     Arrival date & time 12/01/13  1118 History  This chart was scribed for Trixie Dredge PA-C, a non-physician practitioner working with Ethelda Chick, MD by Lewanda Rife, ED Scribe. This patient was seen in room TR07C/TR07C and the patient's care was started at 1:05 PM     Chief Complaint  Patient presents with  . Back Pain   (Consider location/radiation/quality/duration/timing/severity/associated sxs/prior Treatment) The history is provided by the patient. No language interpreter was used.   HPI Comments: Kelli Sandoval is a 67 y.o. female who presents to the Emergency Department with PMHx of similar back pain complaining of constant moderate back pain onset PTA while putting on thermal pants "pulled a muscle" in bilateral legs. Describes pain as shooting down bilateral legs.  Reports pain is exacerbated by touch, movement, and certain positions. Denies any alleviating factors. Reports trying celebrex with no relief of symptoms. Denies associated fall, recent trauma, blood in stool, dysuria, hematuria, abdominal pain, recent URI, and generalized myalgias. Denies urinary or fecal incontinence, urinary retention, perineal/saddle paresthesias, fever, PMHx of cancer, and IV drug use.   Past Medical History  Diagnosis Date  . Hypertension   . Hypothyroidism   . Colorectal polyps   . Allergic rhinitis    Past Surgical History  Procedure Laterality Date  . Allergic rhinitis    . Lung surgery  25 years ago    benign tumor  . Colonoscopy  10/13/2011    Procedure: COLONOSCOPY;  Surgeon: Barrie Folk, MD;  Location: [facility] ENDOSCOPY;  Service: Endoscopy;  Laterality: N/A;   History reviewed. No pertinent family history. History  Substance Use Topics  . Smoking status: Current Some Day Smoker    Types: Cigarettes  . Smokeless tobacco: Not on file  . Alcohol Use: No   OB History   Grav Para Term Preterm Abortions TAB SAB Ect Mult Living                 Review of  Systems  Constitutional: Negative for fever and chills.  Gastrointestinal: Negative for nausea, vomiting, abdominal pain and diarrhea.  Genitourinary: Negative for dysuria, urgency and frequency.  Musculoskeletal: Positive for back pain.  Skin: Negative for wound.  Neurological: Negative for weakness and numbness.  Psychiatric/Behavioral: Negative for confusion.    Allergies  Review of patient's allergies indicates no known allergies.  Home Medications   Current Outpatient Rx  Name  Route  Sig  Dispense  Refill  . fish oil-omega-3 fatty acids 1000 MG capsule   Oral   Take 1,200 mg by mouth daily.           . hydrochlorothiazide (HYDRODIURIL) 25 MG tablet   Oral   Take 25 mg by mouth daily.           [name] Kitchen levothyroxine (SYNTHROID, LEVOTHROID) 25 MCG tablet   Oral   Take 25 mcg by mouth daily.           . Loratadine (CLARITIN PO)   Oral   Take by mouth.         . venlafaxine (EFFEXOR-XR) 75 MG 24 hr capsule   Oral   Take 75 mg by mouth daily.            BP 124/66  Pulse 86  Temp(Src) 98.7 F (37.1 C) (Oral)  Resp 18  SpO2 97% Physical Exam  Nursing note and vitals reviewed. Constitutional: She appears well-developed and well-nourished. No distress.  HENT:  Head: Normocephalic and  atraumatic.  Eyes: Conjunctivae and EOM are normal.  Neck: Normal range of motion. Neck supple.  Cardiovascular:  Intact distal pulses, capillary refill < 3 seconds  Musculoskeletal:  Lower extremities:  Strength 5/5, sensation intact, distal pulses intact.     TTP of lumbar and sacral region, no crepitus or step-offs. Thoracic and cervical spine nontender.     Neurological:  No sensory deficit  Skin: She is not diaphoretic.  Skin intact, no tenting    ED Course  Procedures  COORDINATION OF CARE:  Nursing notes reviewed. Vital signs reviewed. Initial pt interview and examination performed.   1:09 PM-Discussed work up plan with pt at bedside, which includes x-ray of  lumbar spine. Pt agrees with plan.  2:39 PM Nursing Notes Reviewed/ Care Coordinated Applicable Imaging Reviewed incorporated into ED treatment Discussed results and treatment plan with pt. Pt demonstrates understanding and agrees with plan.   Treatment plan initiated: Medications  morphine 4 MG/ML injection 4 mg (4 mg Intramuscular Given 12/01/13 1316)  ondansetron (ZOFRAN-ODT) disintegrating tablet 4 mg (4 mg Oral Given 12/01/13 1316)     Initial diagnostic testing ordered.    Labs Review Labs Reviewed - No data to display Imaging Review Dg Lumbar Spine Complete  12/01/2013   CLINICAL DATA:  Twisting injury with back pain radiating into both lower extremities.  EXAM: LUMBAR SPINE - COMPLETE 4+ VIEW  COMPARISON:  03/08/2005  FINDINGS: No acute fracture or subluxation is identified. There is some progression of degenerative disc disease and spondylosis at the L4-5 level with further disc space narrowing present as well as more prominent facet hypertrophy since the prior exam in 2006. Milder spondylosis present at L5-S1.  IMPRESSION: Increased prominence of lumbar spondylosis since 2006, especially at L4-5. There also is evidence is spondylosis at L5-S1.   Electronically Signed   By: Irish Lack M.D.   On: 12/01/2013 14:18    EKG Interpretation   None      1:26 PM Dr Karma Ganja made aware of the patient.   Pt reexamined after pain medication given - she has full 5/5 strength in her bilateral lower extremities.   MDM   1. Low back pain   2. Lumbar radiculopathy    Pt with low back pain with radiation down both legs.  Neurovascularly intact.  Pain is similar to last time she injured her back - it is positional and movement related.  Xray shows worsening chronic changes (see above).  No red flags.  D/C home with ultram for pain.  PCP follow up (pt has already scheduled an appointment).  Discussed result, findings, treatment, and follow up  with patient.  Pt given return  precautions.  Pt verbalizes understanding and agrees with plan.      I personally performed the services described in this documentation, which was scribed in my presence. The recorded information has been reviewed and is accurate.    Trixie Dredge, PA-C 12/01/13 1549

## 2013-12-01 NOTE — ED Notes (Signed)
Per pt sts she was putting on some clothes and pulled muscle. sts pain in both legs and back.

## 2013-12-01 NOTE — ED Provider Notes (Signed)
Medical screening examination/treatment/procedure(s) were performed by non-physician practitioner and as supervising physician I was immediately available for consultation/collaboration.  EKG Interpretation   None        Martha K Linker, MD 12/01/13 1553 

## 2014-03-03 ENCOUNTER — Emergency Department (HOSPITAL_COMMUNITY)
Admission: EM | Admit: 2014-03-03 | Discharge: 2014-03-03 | Disposition: A | Discharge status: Home / Self Care | Payer: Medicare HMO | Attending: Emergency Medicine | Admitting: Emergency Medicine

## 2014-03-03 ENCOUNTER — Encounter (HOSPITAL_COMMUNITY): Payer: Self-pay | Admitting: Emergency Medicine

## 2014-03-03 ENCOUNTER — Emergency Department (HOSPITAL_COMMUNITY): Payer: Medicare HMO

## 2014-03-03 DIAGNOSIS — Z860100 Personal history of colon polyps, unspecified: Secondary | ICD-10-CM | POA: Insufficient documentation

## 2014-03-03 DIAGNOSIS — H18419 Arcus senilis, unspecified eye: Secondary | ICD-10-CM | POA: Insufficient documentation

## 2014-03-03 DIAGNOSIS — R002 Palpitations: Secondary | ICD-10-CM | POA: Insufficient documentation

## 2014-03-03 DIAGNOSIS — R42 Dizziness and giddiness: Secondary | ICD-10-CM

## 2014-03-03 DIAGNOSIS — Z8709 Personal history of other diseases of the respiratory system: Secondary | ICD-10-CM | POA: Insufficient documentation

## 2014-03-03 DIAGNOSIS — D509 Iron deficiency anemia, unspecified: Secondary | ICD-10-CM | POA: Insufficient documentation

## 2014-03-03 DIAGNOSIS — E039 Hypothyroidism, unspecified: Secondary | ICD-10-CM | POA: Insufficient documentation

## 2014-03-03 DIAGNOSIS — F17200 Nicotine dependence, unspecified, uncomplicated: Secondary | ICD-10-CM | POA: Insufficient documentation

## 2014-03-03 DIAGNOSIS — Z79899 Other long term (current) drug therapy: Secondary | ICD-10-CM | POA: Insufficient documentation

## 2014-03-03 DIAGNOSIS — K644 Residual hemorrhoidal skin tags: Secondary | ICD-10-CM | POA: Insufficient documentation

## 2014-03-03 DIAGNOSIS — I10 Essential (primary) hypertension: Secondary | ICD-10-CM | POA: Insufficient documentation

## 2014-03-03 LAB — I-STAT TROPONIN, ED: Troponin i, poc: 0.01 ng/mL (ref 0.00–0.08)

## 2014-03-03 LAB — BASIC METABOLIC PANEL
BUN: 11 mg/dL (ref 6–23)
CO2: 27 mEq/L (ref 19–32)
Calcium: 9.5 mg/dL (ref 8.4–10.5)
Chloride: 95 mEq/L — ABNORMAL LOW (ref 96–112)
Creatinine, Ser: 0.97 mg/dL (ref 0.50–1.10)
GFR calc Af Amer: 69 mL/min — ABNORMAL LOW (ref >90)
GFR, EST NON AFRICAN AMERICAN: 59 mL/min — AB (ref >90)
GLUCOSE: 104 mg/dL — AB (ref 70–99)
Potassium: 3.6 mEq/L — ABNORMAL LOW (ref 3.7–5.3)
Sodium: 137 mEq/L (ref 137–147)

## 2014-03-03 LAB — CBC
HEMATOCRIT: 26.2 % — AB (ref 36.0–46.0)
Hemoglobin: 7.8 g/dL — ABNORMAL LOW (ref 12.0–15.0)
MCH: 20.4 pg — ABNORMAL LOW (ref 26.0–34.0)
MCHC: 29.8 g/dL — AB (ref 30.0–36.0)
MCV: 68.4 fL — ABNORMAL LOW (ref 78.0–100.0)
Platelets: 629 10*3/uL — ABNORMAL HIGH (ref 150–400)
RBC: 3.83 MIL/uL — ABNORMAL LOW (ref 3.87–5.11)
RDW: 19.1 % — ABNORMAL HIGH (ref 11.5–15.5)
WBC: 7.6 10*3/uL (ref 4.0–10.5)

## 2014-03-03 MED ORDER — SODIUM CHLORIDE 0.9 % IV BOLUS (SEPSIS)
500.0000 mL | Freq: Once | INTRAVENOUS | Status: AC
  Administered 2014-03-03: 500 mL via INTRAVENOUS

## 2014-03-03 MED ORDER — FERROUS SULFATE 325 (65 FE) MG PO TABS: 325.0000 mg | ORAL_TABLET | Freq: Two times a day (BID) | ORAL | Status: DC

## 2014-03-03 MED ORDER — SODIUM CHLORIDE 0.9 % IV BOLUS (SEPSIS): 500.0000 mL | Freq: Once | INTRAVENOUS | Status: DC

## 2014-03-03 NOTE — ED Notes (Addendum)
Pt in c/o dizziness over the last few weeks, went to her PCP and had lab work that didn't give her any answers and symptoms have continued, dizziness with come on after she has been walking for some time or with position change, also palpitations at times, denies pain, denies dizziness at this time

## 2014-03-03 NOTE — ED Provider Notes (Signed)
CSN: [id number]     Arrival date & time 03/03/14  1240 History   First MD Initiated Contact with Patient 03/03/14 1252     Chief Complaint  Patient presents with  . Dizziness     (Consider location/radiation/quality/duration/timing/severity/associated sxs/prior Treatment) HPI Comments: Patient with history of high blood pressure, family history of stroke and heart attack -- presents with complaint of dizziness described as both a spinning and a "swimmy headed feeling" for the past 2 weeks (however patient then states that she saw her primary care physician in 02/13/14 for the same symptoms). Symptoms occur more when the patient is standing up. Patient states that she knows that she has 'to slow down' when she stands up. Symptoms are not associated with headache, fever, neck pain, nausea, or vomiting. Patient denies signs of stroke including: facial droop, slurred speech, aphasia, weakness/numbness in extremities, imbalance/trouble walking. Patient also states that for the past several days she has had a racing heart sensation. She had this earlier today and it scared her because of her family's history. Palpitations were not associated with chest pain, lightheadedness, shortness of breath. She also denies blood in her stool, black tarry stools, or other bleeding. She states that she did feel dizzy while this episode occurring. The onset of this condition was acute. The course is intermittent. Alleviating factors: none.    Patient is a 68 y.o. female presenting with dizziness. The history is provided by the patient and medical records.  Dizziness Associated symptoms: palpitations   Associated symptoms: no blood in stool, no chest pain, no headaches, no nausea, no shortness of breath and no vomiting     Past Medical History  Diagnosis Date  . Hypertension   . Hypothyroidism   . Colorectal polyps   . Allergic rhinitis    Past Surgical History  Procedure Laterality Date  . Allergic rhinitis     . Lung surgery  25 years ago    benign tumor  . Colonoscopy  10/13/2011    Procedure: COLONOSCOPY;  Surgeon: Missy Sabins, MD;  Location: [facility];  Service: Endoscopy;  Laterality: N/A;   History reviewed. No pertinent family history. History  Substance Use Topics  . Smoking status: Current Some Day Smoker    Types: Cigarettes  . Smokeless tobacco: Not on file  . Alcohol Use: No   OB History   Grav Para Term Preterm Abortions TAB SAB Ect Mult Living                 Review of Systems  Constitutional: Negative for fever.  HENT: Negative for congestion, dental problem, rhinorrhea and sinus pressure.   Eyes: Negative for photophobia, discharge, redness and visual disturbance.  Respiratory: Negative for cough and shortness of breath.   Cardiovascular: Positive for palpitations. Negative for chest pain and leg swelling.  Gastrointestinal: Negative for nausea, vomiting, blood in stool, anal bleeding and rectal pain.  Musculoskeletal: Negative for gait problem, neck pain and neck stiffness.  Skin: Negative for rash.  Neurological: Positive for dizziness and light-headedness. Negative for syncope, speech difficulty, weakness, numbness and headaches.  Psychiatric/Behavioral: Negative for confusion.    Allergies  Review of patient's allergies indicates no known allergies.  Home Medications   Current Outpatient Rx  Name  Route  Sig  Dispense  Refill  . fish oil-omega-3 fatty acids 1000 MG capsule   Oral   Take 1,200 mg by mouth daily.           . hydrochlorothiazide (HYDRODIURIL) 25  MG tablet   Oral   Take 25 mg by mouth daily.           [name] Kitchen levothyroxine (SYNTHROID, LEVOTHROID) 25 MCG tablet   Oral   Take 25 mcg by mouth daily.           . Loratadine (CLARITIN PO)   Oral   Take by mouth.         . traMADol (ULTRAM) 50 MG tablet   Oral   Take 1 tablet (50 mg total) by mouth every 6 (six) hours as needed for moderate pain or severe pain.   15 tablet   0   .  venlafaxine (EFFEXOR-XR) 75 MG 24 hr capsule   Oral   Take 75 mg by mouth daily.            BP 131/94  Pulse 85  Temp(Src) 98.1 F (36.7 C) (Oral)  Resp 20  Wt 173 lb 3.2 oz (78.563 kg)  SpO2 100%  Physical Exam  Nursing note and vitals reviewed. Constitutional: She is oriented to person, place, and time. She appears well-developed and well-nourished.  HENT:  Head: Normocephalic and atraumatic.  Right Ear: Tympanic membrane, external ear and ear canal normal.  Left Ear: Tympanic membrane, external ear and ear canal normal.  Nose: Nose normal.  Mouth/Throat: Uvula is midline, oropharynx is clear and moist and mucous membranes are normal.  Eyes: EOM and lids are normal. Pupils are equal, round, and reactive to light. Right eye exhibits no nystagmus. Left eye exhibits no nystagmus.  Arcus senilis. Conjunctival pallor.   Neck: Normal range of motion. Neck supple.  No carotid bruits.  Cardiovascular: Normal rate and regular rhythm.   Pulmonary/Chest: Effort normal and breath sounds normal. No respiratory distress. She has no wheezes. She has no rales.  Abdominal: Soft. There is no tenderness.  Genitourinary: Rectal exam shows external hemorrhoid (small, non-thrombosed). Rectal exam shows no internal hemorrhoid, no fissure, no mass and no tenderness. Guaiac negative stool.  Musculoskeletal:       Cervical back: She exhibits normal range of motion, no tenderness and no bony tenderness.  Neurological: She is alert and oriented to person, place, and time. She has normal strength and normal reflexes. No cranial nerve deficit or sensory deficit. She exhibits normal muscle tone. She displays a negative Romberg sign. Coordination and gait normal. GCS eye subscore is 4. GCS verbal subscore is 5. GCS motor subscore is 6.  Normal cover/uncover  Skin: Skin is warm and dry.  Psychiatric: She has a normal mood and affect.    ED Course  Procedures (including critical care time) Labs  Review Labs Reviewed  BASIC METABOLIC PANEL - Abnormal; Notable for the following:    Potassium 3.6 (*)    Chloride 95 (*)    Glucose, Bld 104 (*)    GFR calc non Af Amer 59 (*)    GFR calc Af Amer 69 (*)    All other components within normal limits  CBC - Abnormal; Notable for the following:    RBC 3.83 (*)    Hemoglobin 7.8 (*)    HCT 26.2 (*)    MCV 68.4 (*)    MCH 20.4 (*)    MCHC 29.8 (*)    RDW 19.1 (*)    Platelets 629 (*)    All other components within normal limits  OCCULT BLOOD X 1 CARD TO LAB, STOOL  I-STAT TROPOININ, ED   Imaging Review Ct Head Wo Contrast  03/03/2014  CLINICAL DATA:  Dizziness.  EXAM: CT HEAD WITHOUT CONTRAST  TECHNIQUE: Contiguous axial images were obtained from the base of the skull through the vertex without intravenous contrast.  COMPARISON:  Nine  FINDINGS: The brainstem, cerebellum, cerebral peduncles, thalamus, basal ganglia, basilar cisterns, and ventricular system appear within normal limits. Periventricular white matter and corona radiata hypodensities favor chronic ischemic microvascular white matter disease. No intracranial hemorrhage, mass lesion, or acute CVA. There is atherosclerotic calcification of the cavernous carotid arteries bilaterally.  IMPRESSION: *No acute intracranial findings. *Periventricular white matter and corona radiata hypodensities favor chronic ischemic microvascular white matter disease.   Electronically Signed   By: Sherryl Barters M.D.   On: 03/03/2014 14:29     EKG Interpretation   Date/Time:  Sunday March 03 2014 12:51:05 EDT Ventricular Rate:  84 PR Interval:  156 QRS Duration: 78 QT Interval:  394 QTC Calculation: 465 R Axis:   -40 Text Interpretation:  Normal sinus rhythm with sinus arrhythmia Left axis  deviation Nonspecific T wave abnormality Abnormal ECG Sinus rhythm  Artifact Abnormal ekg Confirmed by Carmin Muskrat  MD ([id number]) on  03/03/2014 1:06:58 PM      1:30 PM Patient seen and examined.  Work-up initiated.    Vital signs reviewed and are as follows: Filed Vitals:   03/03/14 1248  BP: 131/94  Pulse: 85  Temp: 98.1 F (36.7 C)  Resp: 20   3:47 PM Hgb low. Rectal exam performed by myself with nurse tech chaperone. No gross blood. Hemoccult neg.   Discussed with and seen by Dr. Vanita Panda.   Discussed all labs with patient and daughter. Discussed risks and benefits of transfusion. Patient does have close follow-up. She states she can schedule an appt with her PCP in the next 3 days for recheck.   Patient is comfortable with PCP f/u, close monitoring, return with worsening. I feel this is reasonable as well.   Pt asked to return with worsening symptoms, CP, SOB, near/syncope. Patient verbalizes understanding and agrees with plan. Encouraged to hydrate well.    MDM   Final diagnoses:  Microcytic anemia  Dizziness   Patient with vague complaints of dizziness and palpitations. No CP (trop neg), No SOB/cough. No systemic sx of illness. CT neg. No focal neuro deficits. Patient found to be anemic and slightly orthostatic by pulse rate -- although in ED she does not feel particularly dizzy with standing. No GI bleed history, neg hemoccult. She is comfortable at rest. She has close PCP follow-up. Given these factors, will allow d/c with close monitoring by PCP. Strict return instructions discussed with patient and daughter. They are comfortable with this plan.   No dangerous or life-threatening conditions suspected or identified by history, physical exam, and by work-up. No indications for hospitalization identified.     Carlisle Cater, PA-C 03/03/14 1555

## 2014-03-03 NOTE — ED Notes (Signed)
ekg done at triage.

## 2014-03-03 NOTE — Discharge Instructions (Signed)
Please read and follow all provided instructions.  Your diagnoses today include:  1. Microcytic anemia   2. Dizziness     Tests performed today include:  Blood counts - low red blood cell count  Electrolytes - no concerning findings  Stool test for blood - no blood  Blood test for heart muscle damage - no heart attack  EKG  Vital signs. See below for your results today.   Medications prescribed:   Iron supplement  Take any prescribed medications only as directed.  Home care instructions:  Follow any educational materials contained in this packet.  Follow-up instructions: Please follow-up with your primary care provider in the next 3 days for further evaluation of your symptoms.   Return instructions:   Please return to the Emergency Department if you experience worsening symptoms.   Return with worsening shortness of breath, if you feel severely lightheaded or pass out, if you develop chest pain  Please return if you have any other emergent concerns.   Additional Information:  Your vital signs today were: BP 109/54   Pulse 87   Temp(Src) 98.1 F (36.7 C) (Oral)   Resp 16   Wt 173 lb 3.2 oz (78.563 kg)   SpO2 100% If your blood pressure (BP) was elevated above 135/85 this visit, please have this repeated by your doctor within one month. --------------

## 2014-03-03 NOTE — ED Provider Notes (Signed)
  This was a shared visit with a mid-level provided (NP or PA).  Throughout the patient's course I was available for consultation/collaboration.  I saw the ECG (if appropriate), relevant labs and studies - I agree with the interpretation.  On my exam the patient was in no distress.      Carmin Muskrat, MD 03/03/14 [phone]

## 2014-03-04 LAB — POC OCCULT BLOOD, ED: FECAL OCCULT BLD: NEGATIVE

## 2014-06-26 ENCOUNTER — Other Ambulatory Visit (HOSPITAL_COMMUNITY): Payer: Self-pay | Admitting: Family Medicine

## 2014-06-26 DIAGNOSIS — Z1231 Encounter for screening mammogram for malignant neoplasm of breast: Secondary | ICD-10-CM

## 2014-08-14 ENCOUNTER — Ambulatory Visit (HOSPITAL_COMMUNITY): Payer: Medicare PPO

## 2014-08-19 ENCOUNTER — Emergency Department (HOSPITAL_COMMUNITY)
Admission: EM | Admit: 2014-08-19 | Discharge: 2014-08-20 | Disposition: A | Discharge status: Home / Self Care | Payer: Medicare PPO | Attending: Emergency Medicine | Admitting: Emergency Medicine

## 2014-08-19 ENCOUNTER — Encounter (HOSPITAL_COMMUNITY): Payer: Self-pay | Admitting: Emergency Medicine

## 2014-08-19 DIAGNOSIS — F17200 Nicotine dependence, unspecified, uncomplicated: Secondary | ICD-10-CM | POA: Insufficient documentation

## 2014-08-19 DIAGNOSIS — E039 Hypothyroidism, unspecified: Secondary | ICD-10-CM | POA: Diagnosis not present

## 2014-08-19 DIAGNOSIS — I10 Essential (primary) hypertension: Secondary | ICD-10-CM | POA: Insufficient documentation

## 2014-08-19 DIAGNOSIS — Z79899 Other long term (current) drug therapy: Secondary | ICD-10-CM | POA: Insufficient documentation

## 2014-08-19 DIAGNOSIS — J3489 Other specified disorders of nose and nasal sinuses: Secondary | ICD-10-CM | POA: Insufficient documentation

## 2014-08-19 DIAGNOSIS — Z860100 Personal history of colon polyps, unspecified: Secondary | ICD-10-CM | POA: Insufficient documentation

## 2014-08-19 DIAGNOSIS — J0110 Acute frontal sinusitis, unspecified: Secondary | ICD-10-CM | POA: Diagnosis not present

## 2014-08-19 MED ORDER — FLUTICASONE PROPIONATE 50 MCG/ACT NA SUSP: 2.0000 | Freq: Every day | NASAL | Status: DC

## 2014-08-19 MED ORDER — AMOXICILLIN 500 MG PO CAPS: 500.0000 mg | ORAL_CAPSULE | Freq: Three times a day (TID) | ORAL | Status: DC

## 2014-08-19 MED ORDER — AMOXICILLIN 500 MG PO CAPS
500.0000 mg | ORAL_CAPSULE | Freq: Once | ORAL | Status: AC
  Administered 2014-08-20: 500 mg via ORAL
  Filled 2014-08-19: qty 1

## 2014-08-19 NOTE — ED Notes (Signed)
Pt c/o runny nose and sinus congestion for two weeks.

## 2014-08-19 NOTE — Discharge Instructions (Signed)
Use nasal saline (you can try [employer] Simply Saline) at least 4 times a day, use saline 5-10 minutes before using the fluticasone (flonase)  Do not use Afrin (Oxymetazoline)  Rest, wash hands frequently  and drink plenty of water.  Take your antibiotics as directed and to completion. You should never have any leftover antibiotics! Push fluids and stay well hydrated.   Please follow with your primary care doctor in the next 2 days for a check-up. They must obtain records for further management.   Do not hesitate to return to the Emergency Department for any new, worsening or concerning symptoms.   Sinusitis Sinusitis is redness, soreness, and puffiness (inflammation) of the air pockets in the bones of your face (sinuses). The redness, soreness, and puffiness can cause air and mucus to get trapped in your sinuses. This can allow germs to grow and cause an infection.  HOME CARE   Drink enough fluids to keep your pee (urine) clear or pale yellow.  Use a humidifier in your home.  Run a hot shower to create steam in the bathroom. Sit in the bathroom with the door closed. Breathe in the steam 3-4 times a day.  Put a warm, moist washcloth on your face 3-4 times a day, or as told by your doctor.  Use salt water sprays (saline sprays) to wet the thick fluid in your nose. This can help the sinuses drain.  Only take medicine as told by your doctor. GET HELP RIGHT AWAY IF:   Your pain gets worse.  You have very bad headaches.  You are sick to your stomach (nauseous).  You throw up (vomit).  You are very sleepy (drowsy) all the time.  Your face is puffy (swollen).  Your vision changes.  You have a stiff neck.  You have trouble breathing. MAKE SURE YOU:   Understand these instructions.  Will watch your condition.  Will get help right away if you are not doing well or get worse. Document Released: 05/10/2008 Document Revised: 08/16/2012 Document Reviewed:  06/27/2012 [facility] Patient Information 2015 [place], [place]. This information is not intended to replace advice given to you by your health care provider. Make sure you discuss any questions you have with your health care provider.

## 2014-08-19 NOTE — ED Provider Notes (Signed)
CSN: [id number]     Arrival date & time 08/19/14  2131 History   First MD Initiated Contact with Patient 08/19/14 2311     Chief Complaint  Patient presents with  . Nasal Congestion     (Consider location/radiation/quality/duration/timing/severity/associated sxs/prior Treatment) HPI  Kelli Sandoval is a 68 y.o. female past medical history significant for hypertension, hypothyroid, allergic rhinitis complaining of nasal congestion, sinus pressure and pain worsening over the course of 2 weeks. She denies fever, chills, cough, chest pain, shortness of breath, nausea vomiting, change in bowel or bladder habits.  Past Medical History  Diagnosis Date  . Hypertension   . Hypothyroidism   . Colorectal polyps   . Allergic rhinitis    Past Surgical History  Procedure Laterality Date  . Allergic rhinitis    . Lung surgery  25 years ago    benign tumor  . Colonoscopy  10/13/2011    Procedure: COLONOSCOPY;  Surgeon: Missy Sabins, MD;  Location: [facility];  Service: Endoscopy;  Laterality: N/A;   History reviewed. No pertinent family history. History  Substance Use Topics  . Smoking status: Current Some Day Smoker    Types: Cigarettes  . Smokeless tobacco: Not on file  . Alcohol Use: No   OB History   Grav Para Term Preterm Abortions TAB SAB Ect Mult Living                 Review of Systems  10 systems reviewed and found to be negative, except as noted in the HPI.   Allergies  Review of patient's allergies indicates no known allergies.  Home Medications   Prior to Admission medications   Medication Sig Start Date End Date Taking? Authorizing Provider  ferrous sulfate 325 (65 FE) MG tablet Take 1 tablet (325 mg total) by mouth 2 (two) times daily with a meal. 03/03/14   Carlisle Cater, PA-C  fish oil-omega-3 fatty acids 1000 MG capsule Take 1 g by mouth 2 (two) times daily.     Historical Provider, MD  hydrochlorothiazide (HYDRODIURIL) 25 MG tablet Take 25 mg by mouth  daily.      Historical Provider, MD  levothyroxine (SYNTHROID, LEVOTHROID) 25 MCG tablet Take 25 mcg by mouth daily.      Historical Provider, MD  loratadine (CLARITIN) 10 MG tablet Take 10 mg by mouth daily.    Historical Provider, MD  simvastatin (ZOCOR) 20 MG tablet Take 20 mg by mouth daily. 02/12/14   Historical Provider, MD  venlafaxine (EFFEXOR-XR) 75 MG 24 hr capsule Take 75 mg by mouth daily.      Historical Provider, MD   BP 127/70  Pulse 62  Temp(Src) 98 F (36.7 C) (Oral)  Resp 18  Ht 5\' 9"  (1.753 m)  Wt 184 lb (83.462 kg)  BMI 27.16 kg/m2  SpO2 100% Physical Exam  Nursing note and vitals reviewed. Constitutional: She is oriented to person, place, and time. She appears well-developed and well-nourished. No distress.  HENT:  Head: Normocephalic and atraumatic.  Mouth/Throat: Oropharynx is clear and moist.  Posterior pharynx is injected,  No tonsillar hypertrophy or exudate.  Nasal mucosa is extremely edematous. There is tenderness palpation of bilateral maxillary and frontal sinuses. Left tympanic membrane is dull with decreased light reflex. Right sided cerumen impaction.    Eyes: Conjunctivae and EOM are normal. Pupils are equal, round, and reactive to light.  Cardiovascular: Normal rate, regular rhythm and intact distal pulses.   Pulmonary/Chest: Effort normal and breath sounds normal.  No stridor. No respiratory distress. She has no wheezes. She exhibits no tenderness.  Abdominal: Soft. Bowel sounds are normal. She exhibits no distension and no mass. There is no tenderness. There is no rebound and no guarding.  Musculoskeletal: Normal range of motion.  Neurological: She is alert and oriented to person, place, and time.  Psychiatric: She has a normal mood and affect.    ED Course  Procedures (including critical care time) Labs Review Labs Reviewed - No data to display  Imaging Review No results found.   EKG Interpretation None      MDM   Final  diagnoses:  None    Filed Vitals:   08/19/14 2142  BP: 127/70  Pulse: 62  Temp: 98 F (36.7 C)  TempSrc: Oral  Resp: 18  Height: 5\' 9"  (1.753 m)  Weight: 184 lb (83.462 kg)  SpO2: 100%    Medications  amoxicillin (AMOXIL) capsule 500 mg (500 mg Oral Given 08/20/14 0009)    Kelli Sandoval is a 68 y.o. female presenting with nasal congestion and sinus pressure and pain worsening over the course of 2 weeks. Vital signs without abnormality. Lung sounds are clear to auscultation. Physical exam is consistent with acute sinusitis. Patient will be started on amoxicillin also given Flonase.  This is a shared visit with the attending physician who personally evaluated the patient and agrees with the care plan.   Evaluation does not show pathology that would require ongoing emergent intervention or inpatient treatment. Pt is hemodynamically stable and mentating appropriately. Discussed findings and plan with patient/guardian, who agrees with care plan. All questions answered. Return precautions discussed and outpatient follow up given.   New Prescriptions   AMOXICILLIN (AMOXIL) 500 MG CAPSULE    Take 1 capsule (500 mg total) by mouth 3 (three) times daily.   FLUTICASONE (FLONASE) 50 MCG/ACT NASAL SPRAY    Place 2 sprays into both nostrils daily.         Monico Blitz, PA-C 08/20/14 [phone]

## 2014-08-20 NOTE — ED Provider Notes (Signed)
Patient presents for congestion and facial pain for the past 2 weeks.  It has been the same and not gotten any worse.  Patient states this occurs every year.  No fevers, or diaphoresis at home.  No productive cough.  Patient also has history of allergies.  Likely viral URI vs. Sinusitis.  Medical screening examination/treatment/procedure(s) were conducted as a shared visit with non-physician practitioner(s) and myself.  I personally evaluated the patient during the encounter.   EKG Interpretation None        Everlene Balls, MD 08/20/14 2055

## 2014-08-20 NOTE — ED Notes (Signed)
Declined W/C at D/C and was escorted to lobby by RN. 

## 2014-09-18 ENCOUNTER — Ambulatory Visit (HOSPITAL_COMMUNITY)
Admission: RE | Admit: 2014-09-18 | Discharge: 2014-09-18 | Disposition: A | Discharge status: Home / Self Care | Payer: Medicare HMO | Source: Ambulatory Visit | Attending: Family Medicine | Admitting: Family Medicine

## 2014-09-18 DIAGNOSIS — Z1231 Encounter for screening mammogram for malignant neoplasm of breast: Secondary | ICD-10-CM | POA: Diagnosis not present

## 2014-12-08 ENCOUNTER — Emergency Department (HOSPITAL_COMMUNITY)
Admission: EM | Admit: 2014-12-08 | Discharge: 2014-12-08 | Disposition: A | Discharge status: Home / Self Care | Payer: Commercial Managed Care - HMO | Attending: Emergency Medicine | Admitting: Emergency Medicine

## 2014-12-08 ENCOUNTER — Emergency Department (HOSPITAL_COMMUNITY): Payer: Commercial Managed Care - HMO

## 2014-12-08 ENCOUNTER — Encounter (HOSPITAL_COMMUNITY): Payer: Self-pay | Admitting: Emergency Medicine

## 2014-12-08 DIAGNOSIS — Z792 Long term (current) use of antibiotics: Secondary | ICD-10-CM | POA: Diagnosis not present

## 2014-12-08 DIAGNOSIS — X58XXXA Exposure to other specified factors, initial encounter: Secondary | ICD-10-CM | POA: Insufficient documentation

## 2014-12-08 DIAGNOSIS — Y998 Other external cause status: Secondary | ICD-10-CM | POA: Diagnosis not present

## 2014-12-08 DIAGNOSIS — Z7951 Long term (current) use of inhaled steroids: Secondary | ICD-10-CM | POA: Insufficient documentation

## 2014-12-08 DIAGNOSIS — E039 Hypothyroidism, unspecified: Secondary | ICD-10-CM | POA: Insufficient documentation

## 2014-12-08 DIAGNOSIS — Z8709 Personal history of other diseases of the respiratory system: Secondary | ICD-10-CM | POA: Diagnosis not present

## 2014-12-08 DIAGNOSIS — S3992XA Unspecified injury of lower back, initial encounter: Secondary | ICD-10-CM | POA: Insufficient documentation

## 2014-12-08 DIAGNOSIS — Y9389 Activity, other specified: Secondary | ICD-10-CM | POA: Diagnosis not present

## 2014-12-08 DIAGNOSIS — Y9289 Other specified places as the place of occurrence of the external cause: Secondary | ICD-10-CM | POA: Insufficient documentation

## 2014-12-08 DIAGNOSIS — M545 Low back pain: Secondary | ICD-10-CM | POA: Diagnosis not present

## 2014-12-08 DIAGNOSIS — Z720 Tobacco use: Secondary | ICD-10-CM | POA: Diagnosis not present

## 2014-12-08 DIAGNOSIS — Z8719 Personal history of other diseases of the digestive system: Secondary | ICD-10-CM | POA: Diagnosis not present

## 2014-12-08 DIAGNOSIS — M549 Dorsalgia, unspecified: Secondary | ICD-10-CM

## 2014-12-08 DIAGNOSIS — I10 Essential (primary) hypertension: Secondary | ICD-10-CM | POA: Diagnosis not present

## 2014-12-08 DIAGNOSIS — M5136 Other intervertebral disc degeneration, lumbar region: Secondary | ICD-10-CM | POA: Diagnosis not present

## 2014-12-08 MED ORDER — MORPHINE SULFATE 4 MG/ML IJ SOLN
4.0000 mg | Freq: Once | INTRAMUSCULAR | Status: AC
  Administered 2014-12-08: 4 mg via INTRAMUSCULAR
  Filled 2014-12-08: qty 1

## 2014-12-08 MED ORDER — ONDANSETRON 4 MG PO TBDP
4.0000 mg | ORAL_TABLET | Freq: Once | ORAL | Status: AC
  Administered 2014-12-08: 4 mg via ORAL
  Filled 2014-12-08: qty 1

## 2014-12-08 MED ORDER — TRAMADOL HCL 50 MG PO TABS: 50.0000 mg | ORAL_TABLET | Freq: Four times a day (QID) | ORAL | Status: DC | PRN

## 2014-12-08 NOTE — Discharge Instructions (Signed)
Please read and follow all provided instructions.  Your diagnoses today include:  1. Back pain    Tests performed today include:  Vital signs - see below for your results today  X-ray of your back - does not show any new problems, you have some arthritis  Medications prescribed:   Tramadol - narcotic-like pain medication  DO NOT drive or perform any activities that require you to be awake and alert because this medicine can make you drowsy.   Take any prescribed medications only as directed.  Home care instructions:   Follow any educational materials contained in this packet  Please rest, use ice or heat on your back for the next several days  Do not lift, push, pull anything more than 10 pounds for the next week  Follow-up instructions: Please follow-up with your primary care provider in the next 1 week for further evaluation of your symptoms.   Return instructions:  SEEK IMMEDIATE MEDICAL ATTENTION IF YOU HAVE:  New numbness, tingling, weakness, or problem with the use of your arms or legs  Severe back pain not relieved with medications  Loss control of your bowels or bladder  Increasing pain in any areas of the body (such as chest or abdominal pain)  Shortness of breath, dizziness, or fainting.   Worsening nausea (feeling sick to your stomach), vomiting, fever, or sweats  Any other emergent concerns regarding your health   Additional Information:  Your vital signs today were: BP 118/65 mmHg   Pulse 55   Temp(Src) 98.1 F (36.7 C) (Oral)   Resp 18   SpO2 100% If your blood pressure (BP) was elevated above 135/85 this visit, please have this repeated by your doctor within one month. --------------

## 2014-12-08 NOTE — ED Notes (Signed)
Pt presents to ED with complaint of lower back pain 10/10. Pt denies fall/trauma. Pt states she was turning around to grab something yesterday and thinks she "may have pulled something." Alert and oriented x4. Speaking in clear sentences. Pt states numbness and tingling in her legs with ambulation.

## 2014-12-08 NOTE — ED Provider Notes (Signed)
CSN: [id number]     Arrival date & time 12/08/14  0932 History   First MD Initiated Contact with Patient 12/08/14 [id number]     Chief Complaint  Patient presents with  . Back Pain     (Consider location/radiation/quality/duration/timing/severity/associated sxs/prior Treatment) HPI Comments: Patient presents with acute onset of lower back pain starting last evening after she twisted awkwardly. Pain began as more mild pain and is gradually worsened overnight. Pain radiates from lower back into bilateral lower extremities past her knees. She denies numbness, tingling, or weakness in her legs to me (in contrast to what nursing note states). Patient states that she has been walking but slowly. Patient denies warning symptoms of back pain including: fecal incontinence, urinary retention or overflow incontinence, night sweats, waking from sleep with back pain, unexplained fevers or weight loss, h/o cancer, IVDU, recent trauma. She took Tylenol with her morning medications today without relief. Patient had similar back pain in 11/2013. She was given pain medication in the ED with good relief and had x-ray showing spondylosis. Patient states she has not had a significant flare of back pain in the past year. The onset of this condition was acute. The course is constant. Aggravating factors: movement. Alleviating factors: none.     Patient is a 69 y.o. female presenting with back pain. The history is provided by the patient and medical records.  Back Pain Associated symptoms: no dysuria, no fever, no numbness, no pelvic pain and no weakness     Past Medical History  Diagnosis Date  . Hypertension   . Hypothyroidism   . Colorectal polyps   . Allergic rhinitis    Past Surgical History  Procedure Laterality Date  . Allergic rhinitis    . Lung surgery  25 years ago    benign tumor  . Colonoscopy  10/13/2011    Procedure: COLONOSCOPY;  Surgeon: Missy Sabins, MD;  Location: [facility];  Service: Endoscopy;   Laterality: N/A;   History reviewed. No pertinent family history. History  Substance Use Topics  . Smoking status: Current Some Day Smoker    Types: Cigarettes  . Smokeless tobacco: Not on file  . Alcohol Use: No   OB History    No data available     Review of Systems  Constitutional: Negative for fever and unexpected weight change.  Gastrointestinal: Negative for constipation.       Negative for fecal incontinence.   Genitourinary: Negative for dysuria, hematuria, flank pain, vaginal bleeding, vaginal discharge and pelvic pain.       Negative for urinary incontinence or retention.  Musculoskeletal: Positive for back pain.  Neurological: Negative for weakness and numbness.       Denies saddle paresthesias.      Allergies  Review of patient's allergies indicates no known allergies.  Home Medications   Prior to Admission medications   Medication Sig Start Date End Date Taking? Authorizing Provider  ferrous sulfate 325 (65 FE) MG tablet Take 1 tablet (325 mg total) by mouth 2 (two) times daily with a meal. 03/03/14  Yes Carlisle Cater, PA-C  fish oil-omega-3 fatty acids 1000 MG capsule Take 1 g by mouth 2 (two) times daily.    Yes Historical Provider, MD  fluticasone (FLONASE) 50 MCG/ACT nasal spray Place 2 sprays into both nostrils daily. 08/19/14  Yes Nicole Pisciotta, PA-C  hydrochlorothiazide (HYDRODIURIL) 25 MG tablet Take 25 mg by mouth daily.     Yes Historical Provider, MD  levothyroxine (SYNTHROID, LEVOTHROID) 25 MCG  tablet Take 25 mcg by mouth daily.     Yes Historical Provider, MD  loratadine (CLARITIN) 10 MG tablet Take 10 mg by mouth daily.   Yes Historical Provider, MD  simvastatin (ZOCOR) 20 MG tablet Take 20 mg by mouth daily. 02/12/14  Yes Historical Provider, MD  venlafaxine (EFFEXOR-XR) 75 MG 24 hr capsule Take 75 mg by mouth daily.     Yes Historical Provider, MD  amoxicillin (AMOXIL) 500 MG capsule Take 1 capsule (500 mg total) by mouth 3 (three) times daily.  08/19/14   Nicole Pisciotta, PA-C   BP 117/66 mmHg  Pulse 72  Temp(Src) 98.1 F (36.7 C) (Oral)  Resp 18  SpO2 99% Physical Exam  Constitutional: She appears well-developed and well-nourished.  HENT:  Head: Normocephalic and atraumatic.  Eyes: Conjunctivae are normal.  Neck: Normal range of motion. Neck supple.  Pulmonary/Chest: Effort normal.  Abdominal: Soft. There is no tenderness. There is no CVA tenderness.  Musculoskeletal: She exhibits no edema.       Right hip: Normal.       Left hip: Normal.       Right knee: Normal.       Left knee: Normal.       Right ankle: Normal.       Left ankle: Normal.       Cervical back: Normal.       Thoracic back: Normal.       Lumbar back: She exhibits decreased range of motion, tenderness and bony tenderness.       Back:  No step-off noted with palpation of spine.   Neurological: She is alert. She has normal strength and normal reflexes. No sensory deficit.  5/5 strength in entire lower extremities bilaterally. No sensation deficit.   Skin: Skin is warm and dry. No rash noted.  Psychiatric: She has a normal mood and affect.  Nursing note and vitals reviewed.   ED Course  Procedures (including critical care time) Labs Review Labs Reviewed - No data to display  Imaging Review Dg Lumbar Spine Complete  12/08/2014   CLINICAL DATA:  Severe low back pain since yesterday. Numbness and tingling in the legs.  EXAM: LUMBAR SPINE - COMPLETE 4+ VIEW  COMPARISON:  Radiographs dated 12/01/2013  FINDINGS: There is no fracture or bone destruction. There is grade 1 spondylolisthesis of L5 on S1 due to bilateral facet arthritis on elongated pedicles. There is slight narrowing of the L4-5 disc space. The appearance is unchanged. Extensive calcification is present in the abdominal aorta and iliac arteries.  IMPRESSION: No acute abnormality.  Grade 1 spondylolisthesis of L5 on S1.   Electronically Signed   By: Rozetta Nunnery M.D.   On: 12/08/2014 10:46      EKG Interpretation None       10:10 AM Patient seen and examined. Work-up initiated. Medications ordered. Previous records reviewed.   Vital signs reviewed and are as follows: BP 117/66 mmHg  Pulse 72  Temp(Src) 98.1 F (36.7 C) (Oral)  Resp 18  SpO2 99%  11:26 AM x-ray negative. Upon going to recheck the patient, she is found sitting on the side of the bed with her coat on ready to leave. She states that her pain is now a 1 out of 10. Will discharge to home on tramadol which is what she has had in the past. Encouraged PCP follow-up for management of her back pain.  Patient counseled on use of narcotic pain medications. Counseled not to combine these medications  with others containing tylenol. Urged not to drink alcohol, drive, or perform any other activities that requires focus while taking these medications. The patient verbalizes understanding and agrees with the plan.  Discussed with Dr. Maryan Rued.  MDM   Final diagnoses:  Back pain   Patient with back pain. Imaging stable. No neurological deficits. Patient is ambulatory. No warning symptoms of back pain including: fecal incontinence, urinary retention or overflow incontinence, night sweats, waking from sleep with back pain, unexplained fevers or weight loss, h/o cancer, IVDU, recent trauma. No concern for cauda equina, epidural abscess, or other serious cause of back pain. Conservative measures such as rest, ice/heat and pain medicine indicated with PCP follow-up if no improvement with conservative management.     Carlisle Cater, PA-C 12/08/14 [place], MD 12/09/14 1536

## 2014-12-13 DIAGNOSIS — Z23 Encounter for immunization: Secondary | ICD-10-CM | POA: Diagnosis not present

## 2014-12-13 DIAGNOSIS — E039 Hypothyroidism, unspecified: Secondary | ICD-10-CM | POA: Diagnosis not present

## 2014-12-13 DIAGNOSIS — I10 Essential (primary) hypertension: Secondary | ICD-10-CM | POA: Diagnosis not present

## 2014-12-13 DIAGNOSIS — R7309 Other abnormal glucose: Secondary | ICD-10-CM | POA: Diagnosis not present

## 2014-12-13 DIAGNOSIS — E780 Pure hypercholesterolemia: Secondary | ICD-10-CM | POA: Diagnosis not present

## 2015-06-13 DIAGNOSIS — E119 Type 2 diabetes mellitus without complications: Secondary | ICD-10-CM | POA: Diagnosis not present

## 2015-06-13 DIAGNOSIS — Z1389 Encounter for screening for other disorder: Secondary | ICD-10-CM | POA: Diagnosis not present

## 2015-06-13 DIAGNOSIS — Z23 Encounter for immunization: Secondary | ICD-10-CM | POA: Diagnosis not present

## 2015-06-13 DIAGNOSIS — I10 Essential (primary) hypertension: Secondary | ICD-10-CM | POA: Diagnosis not present

## 2015-06-13 DIAGNOSIS — E780 Pure hypercholesterolemia: Secondary | ICD-10-CM | POA: Diagnosis not present

## 2015-06-13 DIAGNOSIS — E039 Hypothyroidism, unspecified: Secondary | ICD-10-CM | POA: Diagnosis not present

## 2015-06-13 DIAGNOSIS — D509 Iron deficiency anemia, unspecified: Secondary | ICD-10-CM | POA: Diagnosis not present

## 2015-06-13 DIAGNOSIS — H409 Unspecified glaucoma: Secondary | ICD-10-CM | POA: Diagnosis not present

## 2015-09-24 ENCOUNTER — Emergency Department (HOSPITAL_COMMUNITY)
Admission: EM | Admit: 2015-09-24 | Discharge: 2015-09-24 | Disposition: A | Discharge status: Home / Self Care | Payer: Medicare HMO | Attending: Emergency Medicine | Admitting: Emergency Medicine

## 2015-09-24 ENCOUNTER — Encounter (HOSPITAL_COMMUNITY): Payer: Self-pay | Admitting: *Deleted

## 2015-09-24 DIAGNOSIS — M545 Low back pain, unspecified: Secondary | ICD-10-CM

## 2015-09-24 DIAGNOSIS — M5416 Radiculopathy, lumbar region: Secondary | ICD-10-CM

## 2015-09-24 DIAGNOSIS — G8929 Other chronic pain: Secondary | ICD-10-CM | POA: Diagnosis not present

## 2015-09-24 MED ORDER — TRAMADOL HCL 50 MG PO TABS: 50.0000 mg | ORAL_TABLET | Freq: Two times a day (BID) | ORAL | Status: DC | PRN

## 2015-09-24 MED ORDER — MELOXICAM 7.5 MG PO TABS: 7.5000 mg | ORAL_TABLET | Freq: Every day | ORAL | Status: DC | PRN

## 2015-09-24 NOTE — Discharge Instructions (Signed)
Read the information below.  Use the prescribed medication as directed.  Please discuss all new medications with your pharmacist.  You may return to the Emergency Department at any time for worsening condition or any new symptoms that concern you.    If you develop fevers, loss of control of bowel or bladder, weakness or numbness in your legs, or are unable to walk, return to the ER for a recheck.  ° ° °Chronic Back Pain ° When back pain lasts longer than 3 months, it is called chronic back pain. People with chronic back pain often go through certain periods that are more intense (flare-ups).  °CAUSES °Chronic back pain can be caused by wear and tear (degeneration) on different structures in your back. These structures include: °· The bones of your spine (vertebrae) and the joints surrounding your spinal cord and nerve roots (facets). °· The strong, fibrous tissues that connect your vertebrae (ligaments). °Degeneration of these structures may result in pressure on your nerves. This can lead to constant pain. °HOME CARE INSTRUCTIONS °· Avoid bending, heavy lifting, prolonged sitting, and activities which make the problem worse. °· Take brief periods of rest throughout the day to reduce your pain. Lying down or standing usually is better than sitting while you are resting. °· Take over-the-counter or prescription medicines only as directed by your caregiver. °SEEK IMMEDIATE MEDICAL CARE IF:  °· You have weakness or numbness in one of your legs or feet. °· You have trouble controlling your bladder or bowels. °· You have nausea, vomiting, abdominal pain, shortness of breath, or fainting. °  °This information is not intended to replace advice given to you by your health care provider. Make sure you discuss any questions you have with your health care provider. °  °Document Released: 12/30/2004 Document Revised: 02/14/2012 Document Reviewed: 05/12/2015 °Elsevier Interactive Patient Education ©2016 [employer] ° °

## 2015-09-24 NOTE — ED Notes (Signed)
Declined W/C at D/C and was escorted to lobby by RN. 

## 2015-09-24 NOTE — ED Notes (Signed)
PT reports  A pulled muscle on LT side and pain to LT hip area for 3 days. Pt reports pain increases with walking. Pt ambulatory to room.

## 2015-09-24 NOTE — ED Provider Notes (Signed)
Patient presented to the ER with back pain. Patient complaining of pain in the left lower back which runs down her left leg. Symptoms present for 3 days. Worsens with walking. No numbness, tingling, weakness in lower extremities.  Face to face Exam: HEENT - PERRLA Lungs - CTAB Heart - RRR, no M/R/G Abd - S/NT/ND Neuro - alert, oriented x3  Plan: Examination consistent with sciatica. No neurologic deficit. No footdrop, no saddle anesthesia. Treat for sciatica.  Orpah Greek, MD 09/24/15 [phone]

## 2015-10-25 NOTE — ED Provider Notes (Signed)
CSN: [id number]     Arrival date & time 09/24/15  [id number] History   First MD Initiated Contact with Patient 09/24/15 [phone]     Chief Complaint  Patient presents with  . Back Pain     (Consider location/radiation/quality/duration/timing/severity/associated sxs/prior Treatment) The history is provided by the patient.     Pt with hx chronic low back pain presents with exacerbation x 3 days.  Pain is located in the left lower back and radiates down the left leg.  She did lift her groceries recently but denies significant strain.  Denies recent falls or other injury.  Denies fevers, chills, abdominal pain, loss of control of bowel or bladder, weakness of numbness of the extremities, saddle anesthesia, bowel or urinary complaints.    Past Medical History  Diagnosis Date  . Hypertension   . Hypothyroidism   . Colorectal polyps   . Allergic rhinitis    Past Surgical History  Procedure Laterality Date  . Allergic rhinitis    . Lung surgery  25 years ago    benign tumor  . Colonoscopy  10/13/2011    Procedure: COLONOSCOPY;  Surgeon: Missy Sabins, MD;  Location: [facility];  Service: Endoscopy;  Laterality: N/A;   History reviewed. No pertinent family history. Social History  Substance Use Topics  . Smoking status: Current Some Day Smoker    Types: Cigarettes  . Smokeless tobacco: None  . Alcohol Use: No   OB History    No data available     Review of Systems  Constitutional: Negative for fever and chills.  Gastrointestinal: Negative for abdominal pain.  Musculoskeletal: Positive for back pain. Negative for gait problem.  Skin: Negative for color change.  Allergic/Immunologic: Negative for immunocompromised state.  Neurological: Negative for weakness and numbness.  Psychiatric/Behavioral: Negative for self-injury.      Allergies  Review of patient's allergies indicates no known allergies.  Home Medications   Prior to Admission medications   Medication Sig Start Date End  Date Taking? Authorizing Provider  amoxicillin (AMOXIL) 500 MG capsule Take 1 capsule (500 mg total) by mouth 3 (three) times daily. Patient not taking: Reported on 12/08/2014 08/19/14   Elmyra Ricks Pisciotta, PA-C  ferrous sulfate 325 (65 FE) MG tablet Take 1 tablet (325 mg total) by mouth 2 (two) times daily with a meal. 03/03/14   Carlisle Cater, PA-C  fish oil-omega-3 fatty acids 1000 MG capsule Take 1 g by mouth 2 (two) times daily.     Historical Provider, MD  fluticasone (FLONASE) 50 MCG/ACT nasal spray Place 2 sprays into both nostrils daily. 08/19/14   Nicole Pisciotta, PA-C  hydrochlorothiazide (HYDRODIURIL) 25 MG tablet Take 25 mg by mouth daily.      Historical Provider, MD  levothyroxine (SYNTHROID, LEVOTHROID) 25 MCG tablet Take 25 mcg by mouth daily.      Historical Provider, MD  loratadine (CLARITIN) 10 MG tablet Take 10 mg by mouth daily.    Historical Provider, MD  meloxicam (MOBIC) 7.5 MG tablet Take 1 tablet (7.5 mg total) by mouth daily as needed for pain. 09/24/15   Clayton Bibles, PA-C  simvastatin (ZOCOR) 20 MG tablet Take 20 mg by mouth daily. 02/12/14   Historical Provider, MD  traMADol (ULTRAM) 50 MG tablet Take 1 tablet (50 mg total) by mouth every 12 (twelve) hours as needed for moderate pain or severe pain. 09/24/15   Clayton Bibles, PA-C  venlafaxine (EFFEXOR-XR) 75 MG 24 hr capsule Take 75 mg by mouth daily.  Historical Provider, MD   BP 134/63 mmHg  Pulse 73  Temp(Src) 97.4 F (36.3 C)  Resp 16  Ht 5\' 9"  (1.753 m)  Wt 179 lb (81.194 kg)  BMI 26.42 kg/m2  SpO2 100% Physical Exam  Constitutional: She appears well-developed and well-nourished. No distress.  HENT:  Head: Normocephalic and atraumatic.  Neck: Neck supple.  Pulmonary/Chest: Effort normal.  Abdominal: Soft. She exhibits no distension and no mass. There is no tenderness. There is no rebound and no guarding.  Musculoskeletal:  Spine nontender, no crepitus, or stepoffs. Lower extremities:  Strength 5/5,  sensation intact, distal pulses intact.    Left SI joint tenderness.   Neurological: She is alert.  Skin: She is not diaphoretic.  Nursing note and vitals reviewed.   ED Course  Procedures (including critical care time) Labs Review Labs Reviewed - No data to display  Imaging Review No results found. I have personally reviewed and evaluated these images and lab results as part of my medical decision-making.   EKG Interpretation None      MDM   Final diagnoses:  Acute exacerbation of chronic low back pain  Lumbar radiculopathy, chronic    Afebrile, nontoxic patient with exacerbation of chronic back pain.  No red flags with history or exam.  Neurovascularly intact.  Emergent imaging not indicated at this time.   Pt also seen and examined by Dr Betsey Holiday - please see his note for further details. D/C home with mobic, ultram, PCP follow up.  Discussed result, findings, treatment, and follow up  with patient.  Pt given return precautions.  Pt verbalizes understanding and agrees with plan.           Clayton Bibles, PA-C 10/25/15 [place], MD 10/28/15 [phone]

## 2017-01-26 ENCOUNTER — Other Ambulatory Visit: Payer: Self-pay | Admitting: Family Medicine

## 2017-01-26 DIAGNOSIS — Z1231 Encounter for screening mammogram for malignant neoplasm of breast: Secondary | ICD-10-CM

## 2017-01-27 ENCOUNTER — Ambulatory Visit: Payer: Self-pay

## 2017-02-10 ENCOUNTER — Ambulatory Visit
Admission: RE | Admit: 2017-02-10 | Discharge: 2017-02-10 | Disposition: A | Discharge status: Home / Self Care | Payer: Medicare Other | Source: Ambulatory Visit | Attending: Family Medicine | Admitting: Family Medicine

## 2017-02-10 DIAGNOSIS — Z1231 Encounter for screening mammogram for malignant neoplasm of breast: Secondary | ICD-10-CM

## 2017-04-25 ENCOUNTER — Ambulatory Visit
Admission: RE | Admit: 2017-04-25 | Discharge: 2017-04-25 | Disposition: A | Discharge status: Home / Self Care | Payer: Medicare Other | Source: Ambulatory Visit | Attending: Gastroenterology | Admitting: Gastroenterology

## 2017-04-25 ENCOUNTER — Other Ambulatory Visit: Payer: Self-pay | Admitting: Gastroenterology

## 2017-04-25 DIAGNOSIS — Z9889 Other specified postprocedural states: Secondary | ICD-10-CM

## 2017-07-14 ENCOUNTER — Encounter: Payer: Self-pay | Admitting: Hematology

## 2017-07-14 ENCOUNTER — Telehealth: Payer: Self-pay | Admitting: Hematology

## 2017-07-14 NOTE — Telephone Encounter (Signed)
Appt has been scheduled for the pt to see Dr. Irene Limbo on 8/23 at 11am. Pt aware to arrive 30 minutes early. Letter mailed to the pt and faxed to the referring.

## 2017-07-16 ENCOUNTER — Telehealth: Payer: Self-pay

## 2017-07-16 NOTE — Telephone Encounter (Signed)
Spoke with the patient and she is aware of her new patient appt time being moved to 8/22 due to dr kale out of office 8/23 am     Kelli Sandoval

## 2017-07-27 ENCOUNTER — Encounter: Payer: Self-pay | Admitting: Hematology

## 2017-07-27 ENCOUNTER — Ambulatory Visit (HOSPITAL_BASED_OUTPATIENT_CLINIC_OR_DEPARTMENT_OTHER): Payer: Medicare Other

## 2017-07-27 ENCOUNTER — Telehealth: Payer: Self-pay | Admitting: Hematology

## 2017-07-27 ENCOUNTER — Ambulatory Visit (HOSPITAL_BASED_OUTPATIENT_CLINIC_OR_DEPARTMENT_OTHER): Payer: Medicare Other | Admitting: Hematology

## 2017-07-27 VITALS — BP 138/66 | HR 78 | Temp 99.3°F | Resp 16 | Ht 69.0 in | Wt 188.7 lb

## 2017-07-27 DIAGNOSIS — D500 Iron deficiency anemia secondary to blood loss (chronic): Secondary | ICD-10-CM

## 2017-07-27 DIAGNOSIS — D509 Iron deficiency anemia, unspecified: Secondary | ICD-10-CM

## 2017-07-27 DIAGNOSIS — E538 Deficiency of other specified B group vitamins: Secondary | ICD-10-CM

## 2017-07-27 LAB — COMPREHENSIVE METABOLIC PANEL
ALBUMIN: 4.1 g/dL (ref 3.5–5.0)
ALK PHOS: 101 U/L (ref 40–150)
ALT: 9 U/L (ref 0–55)
AST: 15 U/L (ref 5–34)
Anion Gap: 9 mEq/L (ref 3–11)
BILIRUBIN TOTAL: 0.28 mg/dL (ref 0.20–1.20)
BUN: 10.4 mg/dL (ref 7.0–26.0)
CO2: 30 mEq/L — ABNORMAL HIGH (ref 22–29)
Calcium: 9.9 mg/dL (ref 8.4–10.4)
Chloride: 95 mEq/L — ABNORMAL LOW (ref 98–109)
Creatinine: 1 mg/dL (ref 0.6–1.1)
EGFR: 67 mL/min/{1.73_m2} — AB (ref >90)
GLUCOSE: 111 mg/dL (ref 70–140)
Potassium: 3.2 mEq/L — ABNORMAL LOW (ref 3.5–5.1)
SODIUM: 134 meq/L — AB (ref 136–145)
TOTAL PROTEIN: 8.3 g/dL (ref 6.4–8.3)

## 2017-07-27 LAB — IRON AND TIBC
%SAT: 7 % — ABNORMAL LOW (ref 21–57)
Iron: 32 ug/dL — ABNORMAL LOW (ref 41–142)
TIBC: 479 ug/dL — ABNORMAL HIGH (ref 236–444)
UIBC: 447 ug/dL — ABNORMAL HIGH (ref 120–384)

## 2017-07-27 LAB — CBC & DIFF AND RETIC
BASO%: 0.7 % (ref 0.0–2.0)
Basophils Absolute: 0 10*3/uL (ref 0.0–0.1)
EOS ABS: 0 10*3/uL (ref 0.0–0.5)
EOS%: 0.2 % (ref 0.0–7.0)
HCT: 24.6 % — ABNORMAL LOW (ref 34.8–46.6)
HEMOGLOBIN: 7.2 g/dL — AB (ref 11.6–15.9)
IMMATURE RETIC FRACT: 28.7 % — AB (ref 1.60–10.00)
LYMPH%: 32.5 % (ref 14.0–49.7)
MCH: 18.7 pg — ABNORMAL LOW (ref 25.1–34.0)
MCHC: 29.4 g/dL — ABNORMAL LOW (ref 31.5–36.0)
MCV: 63.6 fL — AB (ref 79.5–101.0)
MONO#: 0.7 10*3/uL (ref 0.1–0.9)
MONO%: 10 % (ref 0.0–14.0)
NEUT%: 56.6 % (ref 38.4–76.8)
NEUTROS ABS: 3.9 10*3/uL (ref 1.5–6.5)
Platelets: 583 10*3/uL — ABNORMAL HIGH (ref 145–400)
RBC: 3.87 10*6/uL (ref 3.70–5.45)
RDW: 22.9 % — ABNORMAL HIGH (ref 11.2–14.5)
Retic %: 1.82 % (ref 0.70–2.10)
Retic Ct Abs: 70.43 10*3/uL (ref 33.70–90.70)
WBC: 6.9 10*3/uL (ref 3.9–10.3)
lymph#: 2.2 10*3/uL (ref 0.9–3.3)

## 2017-07-27 LAB — TECHNOLOGIST REVIEW

## 2017-07-27 LAB — FERRITIN: Ferritin: 6 ng/ml — ABNORMAL LOW (ref 9–269)

## 2017-07-27 NOTE — Telephone Encounter (Signed)
Scheduled lab appt per 8/22 - no additional appts scheduled per los - RTC with Dr Irene Limbo based on labs

## 2017-07-27 NOTE — Progress Notes (Addendum)
[name]    HEMATOLOGY/ONCOLOGY CONSULTATION NOTE  Date of Service: 07/27/2017  Patient Care Team: Donald Prose, MD as PCP - General (Family Medicine)  CHIEF COMPLAINTS/PURPOSE OF CONSULTATION:  Anemia Elevated platelets  HISTORY OF PRESENTING ILLNESS:  Kelli Sandoval is a wonderful 71 y.o. female who has been referred to [place] by Dr .Donald Prose, MD  for evaluation and management of Anemia and elevated platelets.  Patient has h/o HTN, hypothyroidism, allergic rhinitis, colorectal polyps who had labs with her PCP on 06/28/2017 which showed hgb of 7.5 MCV of 65, PLT 623k and WBC 5.5k.  Her previous hgb was 7.8 in 12/2016 and she notes that she has been iron deficient "for years". She has been seen by Dr Amedeo Plenty and previously had a colonoscopy in 2012 which showed a polyp and small internal hemorrhoid.  She recent had an unsuccessful attempt at a rpt colonoscopy and Barium enema and notes the experience was quite unpleasant.  She notes that she has taken a week of PO iron pills nut has severe constipation issues even at baseline and notes that she has 3-4 bowel movement every month at baseline.and recently has been having 1-2 BM's every month.  No overt abdominal pain, no nausea/vomiting.  Notes no weight loss.   MEDICAL HISTORY:  Past Medical History:  Diagnosis Date  . Allergic rhinitis   . Colorectal polyps   . Hypertension   . Hypothyroidism     SURGICAL HISTORY: Past Surgical History:  Procedure Laterality Date  . allergic rhinitis    . BREAST BIOPSY  2012  . COLONOSCOPY  10/13/2011   Procedure: COLONOSCOPY;  Surgeon: Missy Sabins, MD;  Location: [facility];  Service: Endoscopy;  Laterality: N/A;  . LUNG SURGERY  25 years ago   benign tumor    SOCIAL HISTORY: Social History   Social History  . Marital status: Divorced    Spouse name: N/A  . Number of children: N/A  . Years of education: N/A   Occupational History  . Not on file.   Social History Main Topics    . Smoking status: Current Some Day Smoker    Types: Cigarettes  . Smokeless tobacco: Never Used     Comment: smokes 1 cigaratte "everyone once in a while"   . Alcohol use No  . Drug use: No  . Sexual activity: Not on file   Other Topics Concern  . Not on file   Social History Narrative  . No narrative on file    FAMILY HISTORY: Family History  Problem Relation Age of Onset  . Breast cancer Sister     ALLERGIES:  has No Known Allergies.  MEDICATIONS:  Current Outpatient Prescriptions  Medication Sig Dispense Refill  . acetaminophen (TYLENOL) 325 MG tablet Take 650 mg by mouth every 6 (six) hours as needed.    . fish oil-omega-3 fatty acids 1000 MG capsule Take 1 g by mouth 2 (two) times daily.     . fluticasone (FLONASE) 50 MCG/ACT nasal spray Place 2 sprays into both nostrils daily. 16 g 0  . levothyroxine (SYNTHROID, LEVOTHROID) 25 MCG tablet Take 25 mcg by mouth daily.      [name] Kitchen loratadine (CLARITIN) 10 MG tablet Take 10 mg by mouth daily.    . meloxicam (MOBIC) 7.5 MG tablet Take 1 tablet (7.5 mg total) by mouth daily as needed for pain. 10 tablet 0  . Simethicone (GAS RELIEF 80 PO) Take by mouth.    . simvastatin (ZOCOR) 20 MG  tablet Take 20 mg by mouth daily.    . traMADol (ULTRAM) 50 MG tablet Take 1 tablet (50 mg total) by mouth every 12 (twelve) hours as needed for moderate pain or severe pain. 10 tablet 0  . venlafaxine (EFFEXOR-XR) 75 MG 24 hr capsule Take 75 mg by mouth daily.       No current facility-administered medications for this visit.     REVIEW OF SYSTEMS:    10 Point review of Systems was done is negative except as noted above.  PHYSICAL EXAMINATION: ECOG PERFORMANCE STATUS:   . Vitals:   07/27/17 1200  BP: 138/66  Pulse: 78  Resp: 16  Temp: 99.3 F (37.4 C)  SpO2: 99%   Filed Weights   07/27/17 1200  Weight: 188 lb 11.2 oz (85.6 kg)   .Body mass index is 27.87 kg/m.  GENERAL:alert, in no acute distress and comfortable SKIN: no  acute rashes, no significant lesions EYES: conjunctiva are pink and non-injected, sclera anicteric OROPHARYNX: MMM, no exudates, no oropharyngeal erythema or ulceration NECK: supple, no JVD LYMPH:  no palpable lymphadenopathy in the cervical, axillary or inguinal regions LUNGS: clear to auscultation b/l with normal respiratory effort HEART: regular rate & rhythm ABDOMEN:  normoactive bowel sounds , non tender, not distended. Extremity: no pedal edema PSYCH: alert & oriented x 3 with fluent speech NEURO: no focal motor/sensory deficits  LABORATORY DATA:  I have reviewed the data as listed  . CBC Latest Ref Rng & Units 07/27/2017 03/03/2014 01/22/2009  WBC 3.9 - 10.3 10e3/uL 6.9 7.6 6.0  Hemoglobin 11.6 - 15.9 g/dL 7.2(L) 7.8(L) 11.9(L)  Hematocrit 34.8 - 46.6 % 24.6(L) 26.2(L) 37.8  Platelets 145 - 400 10e3/uL 583(H) 629(H) 361    . CMP Latest Ref Rng & Units 07/27/2017 03/03/2014 10/27/2010  Glucose 70 - 140 mg/dl 111 104(H) -  BUN 7.0 - 26.0 mg/dL 10.4 11 -  Creatinine 0.6 - 1.1 mg/dL 1.0 0.97 -  Sodium 136 - 145 mEq/L 134(L) 137 -  Potassium 3.5 - 5.1 mEq/L 3.2(L) 3.6(L) -  Chloride 96 - 112 mEq/L - 95(L) -  CO2 22 - 29 mEq/L 30(H) 27 -  Calcium 8.4 - 10.4 mg/dL 9.9 9.5 -  Total Protein 6.4 - 8.3 g/dL 8.3 - 7.6  Total Bilirubin 0.20 - 1.20 mg/dL 0.28 - 0.3  Alkaline Phos 40 - 150 U/L 101 - 102  AST 5 - 34 U/L 15 - 15  ALT 0 - 55 U/L 9 - 9   . Lab Results  Component Value Date   IRON 32 (L) 07/27/2017   TIBC 479 (H) 07/27/2017   IRONPCTSAT 7 (L) 07/27/2017   (Iron and TIBC)  Lab Results  Component Value Date   FERRITIN 6 (L) 07/27/2017   Component     Latest Ref Rng & Units 07/27/2017  Hemoglobin F Quantitation     0.0 - 2.0 % 0.0  Hgb A     96.4 - 98.8 % 98.5  HGB S     0.0 % 0.0  HGB C     0.0 % 0.0  Hemoglobin A2 Quantitation     1.8 - 3.2 % 1.5 (L)  HGB VARIANT     0.0 % 0.0  HGB INTERPRETATION      Comment  Vitamin B12     232 - 1,245 pg/mL 166 (L)       RADIOGRAPHIC STUDIES: I have personally reviewed the radiological images as listed and agreed with the findings in  the report. No results found.  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 Currently undergoing GI w/u with Dr Amedeo Plenty . Lab Results  Component Value Date   IRON 32 (L) 07/27/2017   TIBC 479 (H) 07/27/2017   IRONPCTSAT 7 (L) 07/27/2017   (Iron and TIBC)  Lab Results  Component Value Date   FERRITIN 6 (L) 07/27/2017   3) B12 deficiency ?pernicious anemia Plan -hgb is 7.2 but patient is not having significant symptoms as would be expected with the chronicity of her anemia. - Will proceed with IV Iron replacement with IV Injectafer 750mg  weekly x 2 doses. -B12 2053mcg SL daily and will given 2-4 [place] B12 doses when she comes for IV Iron. -patient was counseled that if she has increased symptoms from her anemia she might need PRBC transfusion. -will get antiparietal cell ab and anti intrinsic factor Ab on followup to r/o pernicious anemia -continue f/u with PCP and GI (Dr Amedeo Plenty) to complete GI w/u to evaluate etiology of severe iron deficiency.  RTC with Dr Kelli Sandoval in 4 weeks post IV Iron with labs to evaluate for early response to IV Iron and B12 replacement.  All of the patients questions were answered with apparent satisfaction. The patient knows to call the clinic with any problems, questions or concerns.  I spent 40 minutes counseling the patient face to face. The total time spent in the appointment was 60 minutes and more than 50% was on counseling and direct patient cares.    Sullivan Lone MD [place] AAHIVMS [facility] Hematology/Oncology Physician [facility]  (Office):       [phone] (Work cell):  [phone] (Fax):           [phone]  07/27/2017 12:40 PM

## 2017-07-27 NOTE — Patient Instructions (Signed)
Thank you for choosing [facility] to provide your oncology and hematology care.  To afford each patient quality time with our providers, please arrive 30 minutes before your scheduled appointment time.  If you arrive late for your appointment, you may be asked to reschedule.  We strive to give you quality time with our providers, and arriving late affects you and other patients whose appointments are after yours.   If you are a no show for multiple scheduled visits, you may be dismissed from the clinic at the providers discretion.    Again, thank you for choosing [facility], our hope is that these requests will decrease the amount of time that you wait before being seen by our physicians.  ______________________________________________________________________  Should you have questions after your visit to the [facility], please contact our office at [phone] between the hours of 8:30 and 4:30 p.m.    Voicemails left after 4:30p.m will not be returned until the following business day.    For prescription refill requests, please have your pharmacy contact us directly.  Please also try to allow 48 hours for prescription requests.    Please contact the scheduling department for questions regarding scheduling.  For scheduling of procedures such as PET scans, CT scans, MRI, Ultrasound, etc please contact central scheduling at [phone].    Resources For Cancer Patients and Caregivers:   Oncolink.org:  A wonderful resource for patients and healthcare providers for information regarding your disease, ways to tract your treatment, what to expect, etc.     American Cancer Society:  800-227-2345  Can help patients locate various types of support and financial assistance  Cancer Care: 1-800-813-HOPE (4673) Provides financial assistance, online support groups, medication/co-pay assistance.    [place] DSS:  336-641-3447 Where to apply for food  stamps, Medicaid, and utility assistance  Medicare Rights Center: 800-333-4114 Helps people with Medicare understand their rights and benefits, navigate the Medicare system, and secure the quality healthcare they deserve  SCAT: 336-333-6589 [employer]'s shared-ride transportation service for eligible riders who have a disability that prevents them from riding the fixed route bus.    For additional information on assistance programs please contact our social worker:   Grier Hock/Abigail Elmore:  [phone]            

## 2017-07-28 ENCOUNTER — Encounter: Payer: Self-pay | Admitting: Hematology

## 2017-07-28 LAB — VITAMIN B12: Vitamin B12: 166 pg/mL — ABNORMAL LOW (ref 232–1245)

## 2017-07-29 LAB — HEMOGLOBINOPATHY EVALUATION
HGB C: 0 %
HGB S: 0 %
HGB VARIANT: 0 %
Hemoglobin A2 Quantitation: 1.5 % — ABNORMAL LOW (ref 1.8–3.2)
Hemoglobin F Quantitation: 0 % (ref 0.0–2.0)
Hgb A: 98.5 % (ref 96.4–98.8)

## 2017-08-03 ENCOUNTER — Other Ambulatory Visit: Payer: Self-pay

## 2017-08-03 ENCOUNTER — Telehealth: Payer: Self-pay

## 2017-08-03 ENCOUNTER — Ambulatory Visit (HOSPITAL_COMMUNITY)
Admission: RE | Admit: 2017-08-03 | Discharge: 2017-08-03 | Disposition: A | Discharge status: Home / Self Care | Payer: Medicare Other | Source: Ambulatory Visit | Attending: Hematology | Admitting: Hematology

## 2017-08-03 DIAGNOSIS — D649 Anemia, unspecified: Secondary | ICD-10-CM | POA: Insufficient documentation

## 2017-08-03 DIAGNOSIS — D509 Iron deficiency anemia, unspecified: Secondary | ICD-10-CM | POA: Insufficient documentation

## 2017-08-03 NOTE — Telephone Encounter (Signed)
Questions regarding transportation and B12 to take OTC. Already spoke with Juliann Pulse, RN and pt verified that we gave the same answers. Pt concerned about benadryl that she would get tomorrow. Told pt it would be best to have someone drop her off and pick her up if she gets drowsy with the benadryl. B12 1050mcg on sale at the store. Told pt it would be okay to take B12 2 tablets 1044mcg daily to equal the 2000 mcg Dr. Irene Limbo wanted her to take. Pt in agreement of plan. Talked again about location of sickle cell to go at 0900 tomorrow morning.

## 2017-08-03 NOTE — Telephone Encounter (Signed)
Will she be ok to drive here and back - she will be getting benadryl and it makes her sleepy. Advised her to get a driver.   Pt has vitamin b12 1000 mg. She is asking if it is OK to take the 2 of those for the 2000 total. Advised yes that is good.

## 2017-08-03 NOTE — Addendum Note (Signed)
Addended by: Sullivan Lone on: 08/03/2017 12:36 AM   Modules accepted: Orders

## 2017-08-04 ENCOUNTER — Encounter (HOSPITAL_COMMUNITY): Payer: Self-pay | Admitting: *Deleted

## 2017-08-04 ENCOUNTER — Ambulatory Visit (HOSPITAL_COMMUNITY)
Admission: RE | Admit: 2017-08-04 | Discharge: 2017-08-04 | Disposition: A | Discharge status: Home / Self Care | Payer: Medicare Other | Source: Ambulatory Visit | Attending: Internal Medicine | Admitting: Internal Medicine

## 2017-08-04 ENCOUNTER — Telehealth: Payer: Self-pay | Admitting: Hematology

## 2017-08-04 ENCOUNTER — Other Ambulatory Visit: Payer: Self-pay | Admitting: *Deleted

## 2017-08-04 DIAGNOSIS — D649 Anemia, unspecified: Secondary | ICD-10-CM

## 2017-08-04 LAB — CBC
HCT: 25.8 % — ABNORMAL LOW (ref 36.0–46.0)
Hemoglobin: 7.5 g/dL — ABNORMAL LOW (ref 12.0–15.0)
MCH: 19.2 pg — ABNORMAL LOW (ref 26.0–34.0)
MCHC: 29.1 g/dL — AB (ref 30.0–36.0)
MCV: 66 fL — AB (ref 78.0–100.0)
PLATELETS: 665 10*3/uL — AB (ref 150–400)
RBC: 3.91 MIL/uL (ref 3.87–5.11)
RDW: 23 % — AB (ref 11.5–15.5)
WBC: 7.3 10*3/uL (ref 4.0–10.5)

## 2017-08-04 LAB — ABO/RH: ABO/RH(D): O POS

## 2017-08-04 LAB — PREPARE RBC (CROSSMATCH)

## 2017-08-04 MED ORDER — SODIUM CHLORIDE 0.9 % IV SOLN
Freq: Once | INTRAVENOUS | Status: AC
  Administered 2017-08-04: 10:00:00 via INTRAVENOUS

## 2017-08-04 MED ORDER — SODIUM CHLORIDE 0.9 % IV SOLN
250.0000 mL | Freq: Once | INTRAVENOUS | Status: AC
  Administered 2017-08-04: 250 mL via INTRAVENOUS

## 2017-08-04 MED ORDER — ACETAMINOPHEN 325 MG PO TABS
650.0000 mg | ORAL_TABLET | Freq: Once | ORAL | Status: AC
  Administered 2017-08-04: 650 mg via ORAL
  Filled 2017-08-04: qty 2

## 2017-08-04 MED ORDER — SODIUM CHLORIDE 0.9 % IV SOLN
750.0000 mg | Freq: Once | INTRAVENOUS | Status: AC
  Administered 2017-08-04: 750 mg via INTRAVENOUS
  Filled 2017-08-04: qty 15

## 2017-08-04 MED ORDER — DIPHENHYDRAMINE HCL 25 MG PO CAPS
25.0000 mg | ORAL_CAPSULE | Freq: Once | ORAL | Status: AC
  Administered 2017-08-04: 25 mg via ORAL
  Filled 2017-08-04: qty 1

## 2017-08-04 NOTE — Discharge Instructions (Signed)
Ferric carboxymaltose injection What is this medicine? FERRIC CARBOXYMALTOSE (ferr-ik car-box-ee-mol-toes) is an iron complex. Iron is used to make healthy red blood cells, which carry oxygen and nutrients throughout the body. This medicine is used to treat anemia in people with chronic kidney disease or people who cannot take iron by mouth. This medicine may be used for other purposes; ask your health care provider or pharmacist if you have questions. COMMON BRAND NAME(S): Injectafer What should I tell my health care provider before I take this medicine? They need to know if you have any of these conditions: -anemia not caused by low iron levels -high levels of iron in the blood -liver disease -an unusual or allergic reaction to iron, other medicines, foods, dyes, or preservatives -pregnant or trying to get pregnant -breast-feeding How should I use this medicine? This medicine is for infusion into a vein. It is given by a health care professional in a hospital or clinic setting. Talk to your pediatrician regarding the use of this medicine in children. Special care may be needed. Overdosage: If you think you have taken too much of this medicine contact a poison control center or emergency room at once. NOTE: This medicine is only for you. Do not share this medicine with others. What if I miss a dose? It is important not to miss your dose. Call your doctor or health care professional if you are unable to keep an appointment. What may interact with this medicine? Do not take this medicine with any of the following medications: -deferoxamine -dimercaprol -other iron products This medicine may also interact with the following medications: -chloramphenicol -deferasirox This list may not describe all possible interactions. Give your health care provider a list of all the medicines, herbs, non-prescription drugs, or dietary supplements you use. Also tell them if you smoke, drink alcohol, or use  illegal drugs. Some items may interact with your medicine. What should I watch for while using this medicine? Visit your doctor or health care professional regularly. Tell your doctor if your symptoms do not start to get better or if they get worse. You may need blood work done while you are taking this medicine. You may need to follow a special diet. Talk to your doctor. Foods that contain iron include: whole grains/cereals, dried fruits, beans, or peas, leafy green vegetables, and organ meats (liver, kidney). What side effects may I notice from receiving this medicine? Side effects that you should report to your doctor or health care professional as soon as possible: -allergic reactions like skin rash, itching or hives, swelling of the face, lips, or tongue -breathing problems -changes in blood pressure -feeling faint or lightheaded, falls -flushing, sweating, or hot feelings Side effects that usually do not require medical attention (report to your doctor or health care professional if they continue or are bothersome): -changes in taste -constipation -dizziness -headache -nausea -pain, redness, or irritation at site where injected -vomiting This list may not describe all possible side effects. Call your doctor for medical advice about side effects. You may report side effects to FDA at 1-800-FDA-1088. Where should I keep my medicine? This drug is given in a hospital or clinic and will not be stored at home. NOTE: This sheet is a summary. It may not cover all possible information. If you have questions about this medicine, talk to your doctor, pharmacist, or health care provider.  2018 Elsevier/Gold Standard (2015-12-25 11:20:47)    Blood Transfusion, Care After This sheet gives you information about how to   care for yourself after your procedure. Your doctor may also give you more specific instructions. If you have problems or questions, contact your doctor. Follow these instructions  at home:  Take over-the-counter and prescription medicines only as told by your doctor.  Go back to your normal activities as told by your doctor.  Follow instructions from your doctor about how to take care of the area where an IV tube was put into your vein (insertion site). Make sure you: ? Wash your hands with soap and water before you change your bandage (dressing). If there is no soap and water, use hand sanitizer. ? Change your bandage as told by your doctor.  Check your IV insertion site every day for signs of infection. Check for: ? More redness, swelling, or pain. ? More fluid or blood. ? Warmth. ? Pus or a bad smell. Contact a doctor if:  You have more redness, swelling, or pain around the IV insertion site..  You have more fluid or blood coming from the IV insertion site.  Your IV insertion site feels warm to the touch.  You have pus or a bad smell coming from the IV insertion site.  Your pee (urine) turns pink, red, or brown.  You feel weak after doing your normal activities. Get help right away if:  You have signs of a serious allergic or body defense (immune) system reaction, including: ? Itchiness. ? Hives. ? Trouble breathing. ? Anxiety. ? Pain in your chest or lower back. ? Fever, flushing, and chills. ? Fast pulse. ? Rash. ? Watery poop (diarrhea). ? Throwing up (vomiting). ? Dark pee. ? Serious headache. ? Dizziness. ? Stiff neck. ? Yellow color in your face or the white parts of your eyes (jaundice). Summary  After a blood transfusion, return to your normal activities as told by your doctor.  Every day, check for signs of infection where the IV tube was put into your vein.  Some signs of infection are warm skin, more redness and pain, more fluid or blood, and pus or a bad smell where the needle went in.  Contact your doctor if you feel weak or have any unusual symptoms. This information is not intended to replace advice given to you by your  health care provider. Make sure you discuss any questions you have with your health care provider. Document Released: 12/13/2014 Document Revised: 07/16/2016 Document Reviewed: 07/16/2016 Elsevier Interactive Patient Education  2017 Elsevier Inc.   

## 2017-08-04 NOTE — Progress Notes (Signed)
Provider:Kale, Cloria [place], MD   Diagnosis Association: Anemia, unspecified type (D64.9)   Treatment: 2 units of PRBC's and Ferric carboxymaltose (INJECTAFER) 750 mg in sodium chloride 0.9 % 250 mL IVPB.   Patient tolerated procedure well with no transfusion reactions. Patient observed for 30 minutes post transfusion. Vital stable and within normal limits. Discharge instructions given to patient and patient states an understanding. Patient alert, oriented, and ambulatory at time of discharge.

## 2017-08-04 NOTE — Telephone Encounter (Signed)
Left message with Yolanda Bonine - aware of appt date and time.

## 2017-08-05 LAB — BPAM RBC
Blood Product Expiration Date: 201810022359
ISSUE DATE / TIME: 201808301022
Unit Type and Rh: 5100

## 2017-08-05 LAB — TYPE AND SCREEN
ABO/RH(D): O POS
Antibody Screen: NEGATIVE
UNIT DIVISION: 0

## 2017-08-31 NOTE — Progress Notes (Signed)
[name] Kitchen    HEMATOLOGY/ONCOLOGY CLINIC NOTE  Date of Service: 09/01/2017  Patient Care Team: Donald Prose, MD as PCP - General (Family Medicine)  CHIEF COMPLAINTS/PURPOSE OF CONSULTATION:  Anemia Elevated platelets  HISTORY OF PRESENTING ILLNESS:  Kelli Sandoval is a wonderful 71 y.o. female who has been referred to [place] by Dr .Donald Prose, MD  for evaluation and management of Anemia and elevated platelets.  Patient has h/o HTN, hypothyroidism, allergic rhinitis, colorectal polyps who had labs with her PCP on 06/28/2017 which showed hgb of 7.5 MCV of 65, PLT 623k and WBC 5.5k.  Her previous hgb was 7.8 in 12/2016 and she notes that she has been iron deficient "for years". She has been seen by Dr Amedeo Plenty and previously had a colonoscopy in 2012 which showed a polyp and small internal hemorrhoid.  She recent had an unsuccessful attempt at a rpt colonoscopy and Barium enema and notes the experience was quite unpleasant.  She notes that she has taken a week of PO iron pills nut has severe constipation issues even at baseline and notes that she has 3-4 bowel movement every month at baseline.and recently has been having 1-2 BM's every month.  No overt abdominal pain, no nausea/vomiting.  Notes no weight loss.  INTERVAL HISTORY:  The patient returns today for follow-up of her anemia. She has been feeling better and energy level has improved.  She did well with the iron infusion. Admits she stopped taking the B12 because she wasn't aware she should continue taking it.  States she has not been able to get out and walk with how the weather has been lately.  The patient is not interested in having a colonoscopy at this time. States she would be okay with completing stool cards.  She reports loose stool recently. Denies abdominal pain or leg swelling.    MEDICAL HISTORY:  Past Medical History:  Diagnosis Date  . Allergic rhinitis   . Colorectal polyps   . Hypertension   . Hypothyroidism      SURGICAL HISTORY: Past Surgical History:  Procedure Laterality Date  . allergic rhinitis    . BREAST BIOPSY  2012  . COLONOSCOPY  10/13/2011   Procedure: COLONOSCOPY;  Surgeon: Missy Sabins, MD;  Location: [facility];  Service: Endoscopy;  Laterality: N/A;  . LUNG SURGERY  25 years ago   benign tumor    SOCIAL HISTORY: Social History   Social History  . Marital status: Divorced    Spouse name: N/A  . Number of children: N/A  . Years of education: N/A   Occupational History  . Not on file.   Social History Main Topics  . Smoking status: Current Some Day Smoker    Types: Cigarettes  . Smokeless tobacco: Never Used     Comment: smokes 1 cigaratte "everyone once in a while"   . Alcohol use No  . Drug use: No  . Sexual activity: Not on file   Other Topics Concern  . Not on file   Social History Narrative  . No narrative on file    FAMILY HISTORY: Family History  Problem Relation Age of Onset  . Breast cancer Sister     ALLERGIES:  has No Known Allergies.  MEDICATIONS:  Current Outpatient Prescriptions  Medication Sig Dispense Refill  . acetaminophen (TYLENOL) 325 MG tablet Take 650 mg by mouth every 6 (six) hours as needed.    . fish oil-omega-3 fatty acids 1000 MG capsule Take 1 g by mouth  2 (two) times daily.     . fluticasone (FLONASE) 50 MCG/ACT nasal spray Place 2 sprays into both nostrils daily. 16 g 0  . levothyroxine (SYNTHROID, LEVOTHROID) 25 MCG tablet Take 25 mcg by mouth daily.      [name] Kitchen loratadine (CLARITIN) 10 MG tablet Take 10 mg by mouth daily.    . meloxicam (MOBIC) 7.5 MG tablet Take 1 tablet (7.5 mg total) by mouth daily as needed for pain. 10 tablet 0  . Simethicone (GAS RELIEF 80 PO) Take by mouth.    . simvastatin (ZOCOR) 20 MG tablet Take 20 mg by mouth daily.    . traMADol (ULTRAM) 50 MG tablet Take 1 tablet (50 mg total) by mouth every 12 (twelve) hours as needed for moderate pain or severe pain. 10 tablet 0  . venlafaxine  (EFFEXOR-XR) 75 MG 24 hr capsule Take 75 mg by mouth daily.       No current facility-administered medications for this visit.     REVIEW OF SYSTEMS:    10 Point review of Systems was done is negative except as noted above.  PHYSICAL EXAMINATION: ECOG PERFORMANCE STATUS:   . Vitals:   09/01/17 1127  BP: (!) 120/52  Pulse: (!) 57  Resp: 18  Temp: 98.3 F (36.8 C)  SpO2: 100%   Filed Weights   09/01/17 1127  Weight: 184 lb 1.6 oz (83.5 kg)   .Body mass index is 27.19 kg/m.  GENERAL:alert, in no acute distress and comfortable SKIN: no acute rashes, no significant lesions EYES: conjunctiva are pink and non-injected, sclera anicteric OROPHARYNX: MMM, no exudates, no oropharyngeal erythema or ulceration NECK: supple, no JVD LYMPH:  no palpable lymphadenopathy in the cervical, axillary or inguinal regions LUNGS: clear to auscultation b/l with normal respiratory effort HEART: regular rate & rhythm ABDOMEN:  normoactive bowel sounds , non tender, not distended. Extremity: no pedal edema PSYCH: alert & oriented x 3 with fluent speech NEURO: no focal motor/sensory deficits  LABORATORY DATA:  I have reviewed the data as listed  . CBC Latest Ref Rng & Units 09/01/2017 08/04/2017 07/27/2017  WBC 3.9 - 10.3 10e3/uL 6.3 7.3 6.9  Hemoglobin 11.6 - 15.9 g/dL 11.3(L) 7.5(L) 7.2(L)  Hematocrit 34.8 - 46.6 % 37.4 25.8(L) 24.6(L)  Platelets 145 - 400 10e3/uL 394 665(H) 583(H)    . CMP Latest Ref Rng & Units 09/01/2017 07/27/2017 03/03/2014  Glucose 70 - 140 mg/dl 104 111 104(H)  BUN 7.0 - 26.0 mg/dL 7.3 10.4 11  Creatinine 0.6 - 1.1 mg/dL 0.9 1.0 0.97  Sodium 136 - 145 mEq/L 136 134(L) 137  Potassium 3.5 - 5.1 mEq/L 3.7 3.2(L) 3.6(L)  Chloride 96 - 112 mEq/L - - 95(L)  CO2 22 - 29 mEq/L 31(H) 30(H) 27  Calcium 8.4 - 10.4 mg/dL 9.9 9.9 9.5  Total Protein 6.4 - 8.3 g/dL 8.1 8.3 -  Total Bilirubin 0.20 - 1.20 mg/dL 0.38 0.28 -  Alkaline Phos 40 - 150 U/L 92 101 -  AST 5 - 34 U/L 14  15 -  ALT 0 - 55 U/L 7 9 -   . Lab Results  Component Value Date   IRON 84 09/01/2017   TIBC 331 09/01/2017   IRONPCTSAT 25 09/01/2017   (Iron and TIBC)  Lab Results  Component Value Date   FERRITIN 110 09/01/2017   Component     Latest Ref Rng & Units 07/27/2017  Hemoglobin F Quantitation     0.0 - 2.0 % 0.0  Hgb A  96.4 - 98.8 % 98.5  HGB S     0.0 % 0.0  HGB C     0.0 % 0.0  Hemoglobin A2 Quantitation     1.8 - 3.2 % 1.5 (L)  HGB VARIANT     0.0 % 0.0  HGB INTERPRETATION      Comment  Vitamin B12     232 - 1,245 pg/mL 166 (L)      RADIOGRAPHIC STUDIES: I have personally reviewed the radiological images as listed and agreed with the findings in the report. No results found.  ASSESSMENT & PLAN:  71 year-old woman with   1) Severe chronic Microcytic Anemia due to iron deficiency. 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 Currently undergoing GI w/u with Dr Amedeo Plenty . Lab Results  Component Value Date   IRON 84 09/01/2017   TIBC 331 09/01/2017   IRONPCTSAT 25 09/01/2017   (Iron and TIBC)  Lab Results  Component Value Date   FERRITIN 110 09/01/2017   3) B12 deficiency ?pernicious anemia -Advised patient to continue taking B12 2043mcg SL daily   Plan -hgb is 11.3 much improved from 7.5 -ferritin level has improved to 110 up from6. No indication for additional IV Iron at this time. -B12 2053mcg SL daily to continue (she has been non compliant with this) -continue f/u with PCP and GI (Dr Amedeo Plenty) to complete GI w/u to evaluate etiology of severe iron deficiency.  RTC with Dr Irene Limbo in 3 months with labs, including iron studies  All of the patients questions were answered with apparent satisfaction. The patient knows to call the clinic with any problems, questions or concerns.  I spent 20 minutes counseling the patient face to face. The total time spent in the appointment was 25 minutes and more than 50% was on counseling and direct patient cares.   This  document serves as a record of services personally performed by Sullivan Lone, MD. It was created on his behalf by Arlyce Harman, a trained medical scribe. The creation of this record is based on the scribe's personal observations and the provider's statements to them. This document has been checked and approved by the attending provider.  Sullivan Lone MD [place] AAHIVMS [facility] Hematology/Oncology Physician [facility]  (Office):       [phone] (Work cell):  [phone] (Fax):           [phone]  09/01/2017 11:41 AM

## 2017-09-01 ENCOUNTER — Ambulatory Visit (HOSPITAL_BASED_OUTPATIENT_CLINIC_OR_DEPARTMENT_OTHER): Payer: Medicare Other | Admitting: Hematology

## 2017-09-01 ENCOUNTER — Other Ambulatory Visit (HOSPITAL_BASED_OUTPATIENT_CLINIC_OR_DEPARTMENT_OTHER): Payer: Medicare Other

## 2017-09-01 ENCOUNTER — Telehealth: Payer: Self-pay | Admitting: Hematology

## 2017-09-01 VITALS — BP 120/52 | HR 57 | Temp 98.3°F | Resp 18 | Ht 69.0 in | Wt 184.1 lb

## 2017-09-01 DIAGNOSIS — E538 Deficiency of other specified B group vitamins: Secondary | ICD-10-CM

## 2017-09-01 DIAGNOSIS — E039 Hypothyroidism, unspecified: Secondary | ICD-10-CM

## 2017-09-01 DIAGNOSIS — D509 Iron deficiency anemia, unspecified: Secondary | ICD-10-CM

## 2017-09-01 DIAGNOSIS — D500 Iron deficiency anemia secondary to blood loss (chronic): Secondary | ICD-10-CM

## 2017-09-01 DIAGNOSIS — D75839 Thrombocytosis, unspecified: Secondary | ICD-10-CM

## 2017-09-01 DIAGNOSIS — D473 Essential (hemorrhagic) thrombocythemia: Secondary | ICD-10-CM

## 2017-09-01 LAB — CBC & DIFF AND RETIC
BASO%: 0.3 % (ref 0.0–2.0)
Basophils Absolute: 0 10*3/uL (ref 0.0–0.1)
EOS ABS: 0 10*3/uL (ref 0.0–0.5)
EOS%: 0.2 % (ref 0.0–7.0)
HCT: 37.4 % (ref 34.8–46.6)
HGB: 11.3 g/dL — ABNORMAL LOW (ref 11.6–15.9)
Immature Retic Fract: 3.5 % (ref 1.60–10.00)
LYMPH%: 29.7 % (ref 14.0–49.7)
MCH: 23.8 pg — ABNORMAL LOW (ref 25.1–34.0)
MCHC: 30.2 g/dL — AB (ref 31.5–36.0)
MCV: 78.9 fL — AB (ref 79.5–101.0)
MONO#: 0.5 10*3/uL (ref 0.1–0.9)
MONO%: 7.7 % (ref 0.0–14.0)
NEUT#: 3.9 10*3/uL (ref 1.5–6.5)
NEUT%: 62.1 % (ref 38.4–76.8)
PLATELETS: 394 10*3/uL (ref 145–400)
RBC: 4.74 10*6/uL (ref 3.70–5.45)
RETIC %: 0.62 % — AB (ref 0.70–2.10)
RETIC CT ABS: 29.39 10*3/uL — AB (ref 33.70–90.70)
WBC: 6.3 10*3/uL (ref 3.9–10.3)
lymph#: 1.9 10*3/uL (ref 0.9–3.3)

## 2017-09-01 LAB — COMPREHENSIVE METABOLIC PANEL
ALK PHOS: 92 U/L (ref 40–150)
ALT: 7 U/L (ref 0–55)
ANION GAP: 9 meq/L (ref 3–11)
AST: 14 U/L (ref 5–34)
Albumin: 4.2 g/dL (ref 3.5–5.0)
BILIRUBIN TOTAL: 0.38 mg/dL (ref 0.20–1.20)
BUN: 7.3 mg/dL (ref 7.0–26.0)
CHLORIDE: 96 meq/L — AB (ref 98–109)
CO2: 31 mEq/L — ABNORMAL HIGH (ref 22–29)
CREATININE: 0.9 mg/dL (ref 0.6–1.1)
Calcium: 9.9 mg/dL (ref 8.4–10.4)
EGFR: 71 mL/min/{1.73_m2} — AB (ref >90)
GLUCOSE: 104 mg/dL (ref 70–140)
Potassium: 3.7 mEq/L (ref 3.5–5.1)
SODIUM: 136 meq/L (ref 136–145)
Total Protein: 8.1 g/dL (ref 6.4–8.3)

## 2017-09-01 LAB — IRON AND TIBC
%SAT: 25 % (ref 21–57)
Iron: 84 ug/dL (ref 41–142)
TIBC: 331 ug/dL (ref 236–444)
UIBC: 248 ug/dL (ref 120–384)

## 2017-09-01 LAB — TECHNOLOGIST REVIEW

## 2017-09-01 LAB — FERRITIN: Ferritin: 110 ng/ml (ref 9–269)

## 2017-09-01 NOTE — Telephone Encounter (Signed)
Scheduled appt per 9/27 los - Gave patient AVS and calender per los.  

## 2017-09-03 LAB — VITAMIN B12: VITAMIN B 12: 207 pg/mL — AB (ref 232–1245)

## 2017-11-08 ENCOUNTER — Telehealth: Payer: Self-pay | Admitting: Hematology

## 2017-11-08 NOTE — Telephone Encounter (Signed)
Spoke to patient re moving 12/27 appt to 12-17

## 2017-11-18 ENCOUNTER — Telehealth: Payer: Self-pay | Admitting: *Deleted

## 2017-11-18 NOTE — Telephone Encounter (Signed)
"  I'm scheduled Monday to see Dr. Irene Limbo but have a conflicting appointment.  Can this appointment be moved later in the day?  I don't want to be late.  Don't know when I'll be available to come Monday."    No availability later Monday, 11-21-2017.  First 20 minute availability is 12-14-2016.  Scheduling message sent to reschedule per patient's scheduling conflict.  Patient notified scheduler will call to confirm.  No further questions or needs at this time.    Call information routed to collaborative and provider to review.

## 2017-11-19 ENCOUNTER — Telehealth: Payer: Self-pay | Admitting: Hematology

## 2017-11-19 NOTE — Telephone Encounter (Signed)
R/s appt per 12/14 sch message - left message for patient with new appt date and time.

## 2017-11-21 ENCOUNTER — Other Ambulatory Visit: Payer: Self-pay

## 2017-11-21 ENCOUNTER — Ambulatory Visit: Payer: Self-pay | Admitting: Hematology

## 2017-12-01 ENCOUNTER — Ambulatory Visit: Payer: Self-pay | Admitting: Hematology

## 2017-12-01 ENCOUNTER — Other Ambulatory Visit: Payer: Self-pay

## 2017-12-13 NOTE — Progress Notes (Signed)
[name] Kitchen    HEMATOLOGY/ONCOLOGY CLINIC NOTE  Date of Service: 12/14/2017  Patient Care Team: Donald Prose, MD as PCP - General (Family Medicine)  CHIEF COMPLAINTS/PURPOSE OF CONSULTATION:  Anemia Elevated platelets  HISTORY OF PRESENTING ILLNESS:  Kelli Sandoval is a wonderful 72 y.o. female who has been referred to [place] by Dr .Donald Prose, MD  for evaluation and management of Anemia and elevated platelets.  Patient has h/o HTN, hypothyroidism, allergic rhinitis, colorectal polyps who had labs with her PCP on 06/28/2017 which showed hgb of 7.5 MCV of 65, PLT 623k and WBC 5.5k.  Her previous hgb was 7.8 in 12/2016 and she notes that she has been iron deficient "for years". She has been seen by Dr Amedeo Plenty and previously had a colonoscopy in 2012 which showed a polyp and small internal hemorrhoid.  She recent had an unsuccessful attempt at a rpt colonoscopy and Barium enema and notes the experience was quite unpleasant.  She notes that she has taken a week of PO iron pills nut has severe constipation issues even at baseline and notes that she has 3-4 bowel movement every month at baseline.and recently has been having 1-2 BM's every month.  No overt abdominal pain, no nausea/vomiting.  Notes no weight loss.  INTERVAL HISTORY:   Kelli Sandoval returns today for follow-up of her anemia. She presents to the clinic today noting she has been well since her last visit. She has not seen Dr. Amedeo Plenty or gotten a GI workup. She does not want a colonoscopy again. She notes her BM are regular and denies bloody or black stool or abdominal pain. She has been taking her B12 vitamins but really does not like the taste of it. She is wondering how long she has to be on it.  She notes she has significant residual hot flashes since stopped hormonal replacements. She notes knee pain after moving some furniture.     MEDICAL HISTORY:  Past Medical History:  Diagnosis Date  . Allergic rhinitis   . Colorectal  polyps   . Hypertension   . Hypothyroidism     SURGICAL HISTORY: Past Surgical History:  Procedure Laterality Date  . allergic rhinitis    . BREAST BIOPSY  2012  . COLONOSCOPY  10/13/2011   Procedure: COLONOSCOPY;  Surgeon: Missy Sabins, MD;  Location: [facility];  Service: Endoscopy;  Laterality: N/A;  . LUNG SURGERY  25 years ago   benign tumor    SOCIAL HISTORY: Social History   Socioeconomic History  . Marital status: Divorced    Spouse name: Not on file  . Number of children: Not on file  . Years of education: Not on file  . Highest education level: Not on file  Social Needs  . Financial resource strain: Not on file  . Food insecurity - worry: Not on file  . Food insecurity - inability: Not on file  . Transportation needs - medical: Not on file  . Transportation needs - non-medical: Not on file  Occupational History  . Not on file  Tobacco Use  . Smoking status: Current Some Day Smoker    Types: Cigarettes  . Smokeless tobacco: Never Used  . Tobacco comment: smokes 1 cigaratte "everyone once in a while"   Substance and Sexual Activity  . Alcohol use: No  . Drug use: No  . Sexual activity: Not on file  Other Topics Concern  . Not on file  Social History Narrative  . Not on file  FAMILY HISTORY: Family History  Problem Relation Age of Onset  . Breast cancer Sister     ALLERGIES:  has No Known Allergies.  MEDICATIONS:  Current Outpatient Medications  Medication Sig Dispense Refill  . acetaminophen (TYLENOL) 325 MG tablet Take 650 mg by mouth every 6 (six) hours as needed.    . fish oil-omega-3 fatty acids 1000 MG capsule Take 1 g by mouth 2 (two) times daily.     . fluticasone (FLONASE) 50 MCG/ACT nasal spray Place 2 sprays into both nostrils daily. 16 g 0  . levothyroxine (SYNTHROID, LEVOTHROID) 25 MCG tablet Take 25 mcg by mouth daily.      [name] Kitchen loratadine (CLARITIN) 10 MG tablet Take 10 mg by mouth daily.    . meloxicam (MOBIC) 7.5 MG tablet Take 1  tablet (7.5 mg total) by mouth daily as needed for pain. 10 tablet 0  . Simethicone (GAS RELIEF 80 PO) Take by mouth.    . simvastatin (ZOCOR) 20 MG tablet Take 20 mg by mouth daily.    . traMADol (ULTRAM) 50 MG tablet Take 1 tablet (50 mg total) by mouth every 12 (twelve) hours as needed for moderate pain or severe pain. 10 tablet 0  . venlafaxine (EFFEXOR-XR) 75 MG 24 hr capsule Take 75 mg by mouth daily.       No current facility-administered medications for this visit.     REVIEW OF SYSTEMS:    10 Point review of Systems was done is negative except as noted above.  PHYSICAL EXAMINATION: ECOG PERFORMANCE STATUS:   . Vitals:   12/14/17 1450  BP: (!) 152/67  Pulse: (!) 52  Resp: 17  Temp: 98.7 F (37.1 C)  SpO2: 100%   Filed Weights   12/14/17 1450  Weight: 179 lb 8 oz (81.4 kg)   .Body mass index is 26.51 kg/m.  GENERAL:alert, in no acute distress and comfortable SKIN: no acute rashes, no significant lesions EYES: conjunctiva are pink and non-injected, sclera anicteric OROPHARYNX: MMM, no exudates, no oropharyngeal erythema or ulceration NECK: supple, no JVD LYMPH:  no palpable lymphadenopathy in the cervical, axillary or inguinal regions LUNGS: clear to auscultation b/l with normal respiratory effort HEART: regular rate & rhythm ABDOMEN:  normoactive bowel sounds , non tender, not distended. Extremity: no pedal edema PSYCH: alert & oriented x 3 with fluent speech NEURO: no focal motor/sensory deficits  LABORATORY DATA:  I have reviewed the data as listed  . CBC Latest Ref Rng & Units 12/14/2017 09/01/2017 08/04/2017  WBC 3.9 - 10.3 10e3/uL - 6.3 7.3  Hemoglobin 11.6 - 15.9 g/dL - 11.3(L) 7.5(L)  Hematocrit 34.8 - 46.6 % 35.0 37.4 25.8(L)  Platelets 145 - 400 10e3/uL - 394 665(H)    . CMP Latest Ref Rng & Units 12/14/2017 09/01/2017 07/27/2017  Glucose 70 - 140 mg/dL 90 104 111  BUN 7 - 26 mg/dL 10 7.3 10.4  Creatinine 0.60 - 1.10 mg/dL 0.90 0.9 1.0  Sodium 136  - 145 mmol/L 137 136 134(L)  Potassium 3.3 - 4.7 mmol/L 3.6 3.7 3.2(L)  Chloride 98 - 109 mmol/L 98 - -  CO2 22 - 29 mmol/L 31(H) 31(H) 30(H)  Calcium 8.4 - 10.4 mg/dL 9.3 9.9 9.9  Total Protein 6.4 - 8.3 g/dL 7.6 8.1 8.3  Total Bilirubin 0.2 - 1.2 mg/dL 0.3 0.38 0.28  Alkaline Phos 40 - 150 U/L 88 92 101  AST 5 - 34 U/L 13 14 15   ALT 0 - 55 U/L 8 7 9    .  Lab Results  Component Value Date   IRON 58 12/14/2017   TIBC 328 12/14/2017   IRONPCTSAT 18 (L) 12/14/2017   (Iron and TIBC)  Lab Results  Component Value Date   FERRITIN 27 12/14/2017     Lab Results  Component Value Date   IRON 84 09/01/2017   TIBC 331 09/01/2017   IRONPCTSAT 25 09/01/2017   (Iron and TIBC)  Lab Results  Component Value Date   FERRITIN 110 09/01/2017   Component     Latest Ref Rng & Units 07/27/2017  Hemoglobin F Quantitation     0.0 - 2.0 % 0.0  Hgb A     96.4 - 98.8 % 98.5  HGB S     0.0 % 0.0  HGB C     0.0 % 0.0  Hemoglobin A2 Quantitation     1.8 - 3.2 % 1.5 (L)  HGB VARIANT     0.0 % 0.0  HGB INTERPRETATION      Comment  Vitamin B12     232 - 1,245 pg/mL 166 (L)      Component     Latest Ref Rng & Units 12/14/2017  Parietal Cell Antibody-IgG     0.0 - 20.0 Units 105.9 (H)  Intrinsic Factor     0.0 - 1.1 AU/mL 41.3 (H)   RADIOGRAPHIC STUDIES: I have personally reviewed the radiological images as listed and agreed with the findings in the report. No results found.  ASSESSMENT & PLAN:  72 year-old woman with   1) Severe chronic Microcytic Anemia due to iron deficiency. 2) Iron deficiency due to ?GI loses vs pernicious anemia. Currently undergoing GI w/u with Dr Amedeo Plenty . Lab Results  Component Value Date   IRON 58 12/14/2017   TIBC 328 12/14/2017   IRONPCTSAT 18 (L) 12/14/2017   (Iron and TIBC)  Lab Results  Component Value Date   FERRITIN 27 12/14/2017   Ferritin is back down to 27 from 110 suggesting ongoing GI losses vs poor po iron absorption.  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 Iron absorption in the setting of pernicious anemia would recommend IV Injectafer weekly x 1 dose -B12 reduce dose to 1039mcg SL daily and continue this (she has been non compliant with this).  -continue f/u with PCP and I strongly encouraged her to follow up with GI (Dr Amedeo Plenty) soon to complete GI w/u to evaluate etiology of severe iron deficiency.  4) Hot flashes -started after discontinuation of hormonal replacements, s/p menopausal.  -I recommend working out and drink plenty of water to help and reduce caffeine intake. - I also recommend homeopathic options, Vitamin E or Evening Primrose Tea  -If her hot flashes are truly significant she can ask her PCP or Gyn for medication to help her.   IV injectafer  x1 dose RTC with Dr Irene Limbo in 3 months with labs, including iron studies  All of the patients questions were answered with apparent satisfaction. The patient knows to call the clinic with any problems, questions or concerns.  I spent 20 minutes counseling the patient face to face. The total time spent in the appointment was 25  minutes and more than 50% was on counseling and direct patient cares.   Sullivan Lone MD MS AAHIVMS [facility] Hematology/Oncology Physician [facility]  (Office):       [phone] (Work cell):  [phone] (Fax):           [phone]  12/14/2017 3:11 PM   This document serves as a record of services personally performed by Sullivan Lone, MD. It was created on his behalf by Joslyn Devon, a trained medical scribe. The creation of this record is based on the scribe's personal observations and the provider's statements to them.    .I have reviewed the above  documentation for accuracy and completeness, and I agree with the above. Brunetta Genera MD MS

## 2017-12-14 ENCOUNTER — Inpatient Hospital Stay (HOSPITAL_BASED_OUTPATIENT_CLINIC_OR_DEPARTMENT_OTHER): Payer: Medicare Other | Admitting: Hematology

## 2017-12-14 ENCOUNTER — Inpatient Hospital Stay: Payer: Medicare Other | Attending: Hematology

## 2017-12-14 ENCOUNTER — Encounter: Payer: Self-pay | Admitting: Hematology

## 2017-12-14 VITALS — BP 152/67 | HR 52 | Temp 98.7°F | Resp 17 | Ht 69.0 in | Wt 179.5 lb

## 2017-12-14 DIAGNOSIS — F17210 Nicotine dependence, cigarettes, uncomplicated: Secondary | ICD-10-CM | POA: Diagnosis not present

## 2017-12-14 DIAGNOSIS — D509 Iron deficiency anemia, unspecified: Secondary | ICD-10-CM

## 2017-12-14 DIAGNOSIS — Z79899 Other long term (current) drug therapy: Secondary | ICD-10-CM | POA: Diagnosis not present

## 2017-12-14 DIAGNOSIS — E538 Deficiency of other specified B group vitamins: Secondary | ICD-10-CM

## 2017-12-14 DIAGNOSIS — D510 Vitamin B12 deficiency anemia due to intrinsic factor deficiency: Secondary | ICD-10-CM | POA: Diagnosis not present

## 2017-12-14 DIAGNOSIS — E039 Hypothyroidism, unspecified: Secondary | ICD-10-CM | POA: Diagnosis not present

## 2017-12-14 DIAGNOSIS — K5900 Constipation, unspecified: Secondary | ICD-10-CM | POA: Insufficient documentation

## 2017-12-14 DIAGNOSIS — K648 Other hemorrhoids: Secondary | ICD-10-CM | POA: Diagnosis not present

## 2017-12-14 DIAGNOSIS — D500 Iron deficiency anemia secondary to blood loss (chronic): Secondary | ICD-10-CM

## 2017-12-14 DIAGNOSIS — I10 Essential (primary) hypertension: Secondary | ICD-10-CM | POA: Insufficient documentation

## 2017-12-14 LAB — COMPREHENSIVE METABOLIC PANEL
ALBUMIN: 4 g/dL (ref 3.5–5.0)
ALT: 8 U/L (ref 0–55)
ANION GAP: 8 (ref 3–11)
AST: 13 U/L (ref 5–34)
Alkaline Phosphatase: 88 U/L (ref 40–150)
BUN: 10 mg/dL (ref 7–26)
CHLORIDE: 98 mmol/L (ref 98–109)
CO2: 31 mmol/L — ABNORMAL HIGH (ref 22–29)
Calcium: 9.3 mg/dL (ref 8.4–10.4)
Creatinine, Ser: 0.9 mg/dL (ref 0.60–1.10)
GFR calc Af Amer: >60 mL/min (ref >60)
GFR calc non Af Amer: >60 mL/min (ref >60)
GLUCOSE: 90 mg/dL (ref 70–140)
POTASSIUM: 3.6 mmol/L (ref 3.3–4.7)
Sodium: 137 mmol/L (ref 136–145)
Total Bilirubin: 0.3 mg/dL (ref 0.2–1.2)
Total Protein: 7.6 g/dL (ref 6.4–8.3)

## 2017-12-14 LAB — VITAMIN B12: Vitamin B-12: 3833 pg/mL — ABNORMAL HIGH (ref 180–914)

## 2017-12-14 LAB — CBC WITH DIFFERENTIAL (CANCER CENTER ONLY)
Abs Granulocyte: 2.5 10*3/uL (ref 1.5–6.5)
Basophils Absolute: 0 10*3/uL (ref 0.0–0.1)
Basophils Relative: 0 %
EOS PCT: 2 %
Eosinophils Absolute: 0.1 10*3/uL (ref 0.0–0.5)
HCT: 35 % (ref 34.8–46.6)
Hemoglobin: 11.3 g/dL — ABNORMAL LOW (ref 11.6–15.9)
LYMPHS PCT: 43 %
Lymphs Abs: 2.3 10*3/uL (ref 0.9–3.3)
MCH: 27.8 pg (ref 25.1–34.0)
MCHC: 32.3 g/dL (ref 31.5–36.0)
MCV: 86 fL (ref 79.5–101.0)
MONO ABS: 0.4 10*3/uL (ref 0.1–0.9)
Monocytes Relative: 7 %
Neutro Abs: 2.5 10*3/uL (ref 1.5–6.5)
Neutrophils Relative %: 48 %
PLATELETS: 299 10*3/uL (ref 145–400)
RBC: 4.07 MIL/uL (ref 3.70–5.45)
RDW: 14.8 % (ref 11.2–16.1)
WBC: 5.3 10*3/uL (ref 4.0–10.3)

## 2017-12-14 LAB — FERRITIN: Ferritin: 27 ng/mL (ref 9–269)

## 2017-12-14 LAB — IRON AND TIBC
Iron: 58 ug/dL (ref 41–142)
SATURATION RATIOS: 18 % — AB (ref 21–57)
TIBC: 328 ug/dL (ref 236–444)
UIBC: 269 ug/dL

## 2017-12-14 NOTE — Patient Instructions (Signed)
Thank you for choosing [facility] to provide your oncology and hematology care.  To afford each patient quality time with our providers, please arrive 30 minutes before your scheduled appointment time.  If you arrive late for your appointment, you may be asked to reschedule.  We strive to give you quality time with our providers, and arriving late affects you and other patients whose appointments are after yours.   If you are a no show for multiple scheduled visits, you may be dismissed from the clinic at the providers discretion.    Again, thank you for choosing [facility], our hope is that these requests will decrease the amount of time that you wait before being seen by our physicians.  ______________________________________________________________________  Should you have questions after your visit to the [facility], please contact our office at [phone] between the hours of 8:30 and 4:30 p.m.    Voicemails left after 4:30p.m will not be returned until the following business day.    For prescription refill requests, please have your pharmacy contact us directly.  Please also try to allow 48 hours for prescription requests.    Please contact the scheduling department for questions regarding scheduling.  For scheduling of procedures such as PET scans, CT scans, MRI, Ultrasound, etc please contact central scheduling at [phone].    Resources For Cancer Patients and Caregivers:   Oncolink.org:  A wonderful resource for patients and healthcare providers for information regarding your disease, ways to tract your treatment, what to expect, etc.     American Cancer Society:  800-227-2345  Can help patients locate various types of support and financial assistance  Cancer Care: 1-800-813-HOPE (4673) Provides financial assistance, online support groups, medication/co-pay assistance.    [place] DSS:  336-641-3447 Where to apply for food  stamps, Medicaid, and utility assistance  Medicare Rights Center: 800-333-4114 Helps people with Medicare understand their rights and benefits, navigate the Medicare system, and secure the quality healthcare they deserve  SCAT: 336-333-6589 [place] [employer]'s shared-ride transportation service for eligible riders who have a disability that prevents them from riding the fixed route bus.    For additional information on assistance programs please contact our social worker:   Grier Hock/Abigail [place]more:  [phone]            

## 2017-12-15 LAB — INTRINSIC FACTOR ANTIBODIES: INTRINSIC FACTOR: 41.3 [AU]/ml — AB (ref 0.0–1.1)

## 2017-12-16 LAB — ANTI-PARIETAL ANTIBODY: Parietal Cell Antibody-IgG: 105.9 Units — ABNORMAL HIGH (ref 0.0–20.0)

## 2017-12-19 ENCOUNTER — Telehealth: Payer: Self-pay

## 2017-12-19 NOTE — Telephone Encounter (Signed)
Spoke with pt about lab work and medication changes per Dr. Irene Limbo. B12 levels are good and pt to reduce SL B12 to 1060mcg/day. Pt verbalized understanding. Dr. Irene Limbo would also recommend for pt to have IV injectafer x 1 dose d/t ferritin of 27. Pt would rather take PO iron if an option, but is willing to come in for IV injectafer. Scheduling message sent.

## 2017-12-19 NOTE — Telephone Encounter (Signed)
Left VM telling pt to call office back and ask to speak with Aldona Bar, RN. Dr. Irene Limbo requested communication with pt regarding medications and treatment.

## 2017-12-28 ENCOUNTER — Inpatient Hospital Stay: Payer: Medicare Other

## 2017-12-28 VITALS — BP 112/58 | HR 56 | Temp 98.1°F | Resp 18

## 2017-12-28 DIAGNOSIS — D509 Iron deficiency anemia, unspecified: Secondary | ICD-10-CM | POA: Diagnosis not present

## 2017-12-28 DIAGNOSIS — D500 Iron deficiency anemia secondary to blood loss (chronic): Secondary | ICD-10-CM

## 2017-12-28 MED ORDER — SODIUM CHLORIDE 0.9 % IV SOLN
INTRAVENOUS | Status: DC
  Administered 2017-12-28: 08:00:00 via INTRAVENOUS

## 2017-12-28 MED ORDER — SODIUM CHLORIDE 0.9 % IV SOLN
750.0000 mg | Freq: Once | INTRAVENOUS | Status: AC
  Administered 2017-12-28: 750 mg via INTRAVENOUS
  Filled 2017-12-28: qty 15

## 2017-12-28 NOTE — Patient Instructions (Signed)
Ferric carboxymaltose injection What is this medicine? FERRIC CARBOXYMALTOSE (ferr-ik car-box-ee-mol-toes) is an iron complex. Iron is used to make healthy red blood cells, which carry oxygen and nutrients throughout the body. This medicine is used to treat anemia in people with chronic kidney disease or people who cannot take iron by mouth. This medicine may be used for other purposes; ask your health care provider or pharmacist if you have questions. COMMON BRAND NAME(S): Injectafer What should I tell my health care provider before I take this medicine? They need to know if you have any of these conditions: -anemia not caused by low iron levels -high levels of iron in the blood -liver disease -an unusual or allergic reaction to iron, other medicines, foods, dyes, or preservatives -pregnant or trying to get pregnant -breast-feeding How should I use this medicine? This medicine is for infusion into a vein. It is given by a health care professional in a hospital or clinic setting. Talk to your pediatrician regarding the use of this medicine in children. Special care may be needed. Overdosage: If you think you have taken too much of this medicine contact a poison control center or emergency room at once. NOTE: This medicine is only for you. Do not share this medicine with others. What if I miss a dose? It is important not to miss your dose. Call your doctor or health care professional if you are unable to keep an appointment. What may interact with this medicine? Do not take this medicine with any of the following medications: -deferoxamine -dimercaprol -other iron products This medicine may also interact with the following medications: -chloramphenicol -deferasirox This list may not describe all possible interactions. Give your health care provider a list of all the medicines, herbs, non-prescription drugs, or dietary supplements you use. Also tell them if you smoke, drink alcohol, or use  illegal drugs. Some items may interact with your medicine. What should I watch for while using this medicine? Visit your doctor or health care professional regularly. Tell your doctor if your symptoms do not start to get better or if they get worse. You may need blood work done while you are taking this medicine. You may need to follow a special diet. Talk to your doctor. Foods that contain iron include: whole grains/cereals, dried fruits, beans, or peas, leafy green vegetables, and organ meats (liver, kidney). What side effects may I notice from receiving this medicine? Side effects that you should report to your doctor or health care professional as soon as possible: -allergic reactions like skin rash, itching or hives, swelling of the face, lips, or tongue -breathing problems -changes in blood pressure -feeling faint or lightheaded, falls -flushing, sweating, or hot feelings Side effects that usually do not require medical attention (report to your doctor or health care professional if they continue or are bothersome): -changes in taste -constipation -dizziness -headache -nausea -pain, redness, or irritation at site where injected -vomiting This list may not describe all possible side effects. Call your doctor for medical advice about side effects. You may report side effects to FDA at 1-800-FDA-1088. Where should I keep my medicine? This drug is given in a hospital or clinic and will not be stored at home. NOTE: This sheet is a summary. It may not cover all possible information. If you have questions about this medicine, talk to your doctor, pharmacist, or health care provider.  2018 Elsevier/Gold Standard (2015-12-25 11:20:47)  

## 2018-01-03 ENCOUNTER — Other Ambulatory Visit: Payer: Self-pay | Admitting: Family Medicine

## 2018-01-03 DIAGNOSIS — Z1231 Encounter for screening mammogram for malignant neoplasm of breast: Secondary | ICD-10-CM

## 2018-01-14 ENCOUNTER — Emergency Department (HOSPITAL_COMMUNITY)
Admission: EM | Admit: 2018-01-14 | Discharge: 2018-01-14 | Discharge status: Against Medical Advice | Payer: Medicare Other | Attending: Emergency Medicine | Admitting: Emergency Medicine

## 2018-01-14 ENCOUNTER — Encounter (HOSPITAL_COMMUNITY): Payer: Self-pay

## 2018-01-14 ENCOUNTER — Inpatient Hospital Stay (HOSPITAL_COMMUNITY): Admit: 2018-01-14 | Payer: Self-pay

## 2018-01-14 ENCOUNTER — Emergency Department (HOSPITAL_COMMUNITY): Payer: Medicare Other

## 2018-01-14 DIAGNOSIS — Z79899 Other long term (current) drug therapy: Secondary | ICD-10-CM | POA: Insufficient documentation

## 2018-01-14 DIAGNOSIS — J449 Chronic obstructive pulmonary disease, unspecified: Secondary | ICD-10-CM | POA: Insufficient documentation

## 2018-01-14 DIAGNOSIS — I10 Essential (primary) hypertension: Secondary | ICD-10-CM | POA: Diagnosis not present

## 2018-01-14 DIAGNOSIS — F17210 Nicotine dependence, cigarettes, uncomplicated: Secondary | ICD-10-CM | POA: Insufficient documentation

## 2018-01-14 DIAGNOSIS — M25561 Pain in right knee: Secondary | ICD-10-CM | POA: Diagnosis present

## 2018-01-14 DIAGNOSIS — E039 Hypothyroidism, unspecified: Secondary | ICD-10-CM | POA: Insufficient documentation

## 2018-01-14 NOTE — ED Provider Notes (Signed)
[facility] EMERGENCY DEPARTMENT Provider Note   CSN: [id number] Arrival date & time: 01/14/18  0945     History   Chief Complaint No chief complaint on file.   HPI Kelli Sandoval is a 72 y.o. female who presents to the ED with knee pain. The pain started 3 weeks ago. No known trauma. Patient taking tylenol arthritis and using a pain cream. Theses helped initially but have stopped working.   HPI  Past Medical History:  Diagnosis Date  . Allergic rhinitis   . Colorectal polyps   . Hypertension   . Hypothyroidism     Patient Active Problem List   Diagnosis Date Noted  . Iron deficiency anemia 08/03/2017  . GASTROENTERITIS 01/06/2011  . HYPERLIPIDEMIA 11/10/2010  . VITAMIN D DEFICIENCY 10/06/2009  . VAGINITIS, CANDIDAL 10/03/2009  . TRICHOMONAL VAGINITIS 10/03/2009  . SHOULDER PAIN, RIGHT 06/16/2009  . MUSCLE SPASM, TRAPEZIUS MUSCLE, RIGHT 04/28/2009  . GASTROENTERITIS, HX OF 01/22/2009  . ESSENTIAL HYPERTENSION, BENIGN 01/30/2008  . DEGENERATIVE JOINT DISEASE, RIGHT KNEE 01/30/2008  . 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 ALLERGIC RHINITIS 07/31/2007  . BURSITIS NOS 07/31/2007  . HYPOTHYROIDISM, UNSPECIFIED 02/02/2007  . TOBACCO DEPENDENCE 02/02/2007  . MIGRAINE, UNSPEC., W/O INTRACTABLE MIGRAINE 02/02/2007  . GLAUCOMA 02/02/2007  . CATARACT 02/02/2007  . COPD 02/02/2007  . BLOOD IN STOOL, MELENA 02/02/2007  . INCONTINENCE, STRESS, FEMALE 02/02/2007  . MENOPAUSAL SYNDROME 02/02/2007    Past Surgical History:  Procedure Laterality Date  . allergic rhinitis    . BREAST BIOPSY  2012  . COLONOSCOPY  10/13/2011   Procedure: COLONOSCOPY;  Surgeon: Missy Sabins, MD;  Location: [facility];  Service: Endoscopy;  Laterality: N/A;  . LUNG SURGERY  25 years ago   benign tumor    OB History    No data available       Home Medications    Prior to Admission medications   Medication Sig Start Date End Date Taking? Authorizing Provider    acetaminophen (TYLENOL) 325 MG tablet Take 650 mg by mouth every 6 (six) hours as needed.    [provider]  fish oil-omega-3 fatty acids 1000 MG capsule Take 1 g by mouth 2 (two) times daily.     [provider]  fluticasone (FLONASE) 50 MCG/ACT nasal spray Place 2 sprays into both nostrils daily. 08/19/14   Pisciotta, Elmyra Ricks, PA-C  levothyroxine (SYNTHROID, LEVOTHROID) 25 MCG tablet Take 25 mcg by mouth daily.      [provider]  loratadine (CLARITIN) 10 MG tablet Take 10 mg by mouth daily.    [provider]  meloxicam (MOBIC) 7.5 MG tablet Take 1 tablet (7.5 mg total) by mouth daily as needed for pain. 09/24/15   Clayton Bibles, PA-C  Simethicone (GAS RELIEF 80 PO) Take by mouth.    [provider]  simvastatin (ZOCOR) 20 MG tablet Take 20 mg by mouth daily. 02/12/14   [provider]  traMADol (ULTRAM) 50 MG tablet Take 1 tablet (50 mg total) by mouth every 12 (twelve) hours as needed for moderate pain or severe pain. 09/24/15   Clayton Bibles, PA-C  venlafaxine (EFFEXOR-XR) 75 MG 24 hr capsule Take 75 mg by mouth daily.      [provider]    Family History Family History  Problem Relation Age of Onset  . Breast cancer Sister     Social History Social History   Tobacco Use  . Smoking status: Current Some Day Smoker  Types: Cigarettes  . Smokeless tobacco: Never Used  . Tobacco comment: smokes 1 cigaratte "everyone once in a while"   Substance Use Topics  . Alcohol use: No  . Drug use: No     Allergies   Patient has no known allergies.   Review of Systems Review of Systems  Musculoskeletal: Positive for arthralgias.       Right knee pain     Physical Exam Updated Vital Signs BP 112/60 (BP Location: Right Arm)   Pulse 60   Temp 98.3 F (36.8 C) (Oral)   Resp 16   SpO2 100%   Physical Exam  Constitutional: She appears well-developed and well-nourished. No distress.  HENT:  Head: Normocephalic.   Eyes: EOM are normal.  Neck: Neck supple.  Cardiovascular: Normal rate.  Pulmonary/Chest: Effort normal.  Musculoskeletal:       Right knee: She exhibits normal range of motion, no laceration, no erythema and normal alignment. Swelling: mild. Tenderness found.  There is tenderness with palpation and range of motion of the right knee. The anterior aspect of the right knee is tender with palpation and with flexion of the knee. Tender with palpation to the posterior aspect of the knee.  Neurological: She is alert.  Skin: Skin is warm and dry.  Nursing note and vitals reviewed.    ED Treatments / Results  Labs (all labs ordered are listed, but only abnormal results are displayed) Labs Reviewed - No data to display  Radiology Dg Knee Complete 4 Views Right  Result Date: 01/14/2018 CLINICAL DATA:  Worsening right knee pain since fall last December. EXAM: RIGHT KNEE - COMPLETE 4+ VIEW COMPARISON:  None. FINDINGS: No acute fracture or dislocation. Mild tricompartmental joint space narrowing. No joint effusion. Osteopenia. Soft tissues are unremarkable. IMPRESSION: 1. No acute osseous abnormality. Mild tricompartmental osteoarthritis. Electronically Signed   By: Titus Dubin M.D.   On: 01/14/2018 10:59    Procedures Procedures (including critical care time)  Medications Ordered in ED Medications - No data to display   Initial Impression / Assessment and Plan / ED Course  I have reviewed the triage vital signs and the nursing notes. I discussed with the patient results of x-ray and concern about the possibility of DVT due to the pain to the posterior aspect of the knee. Patient states she has been here all morning and is not staying any longer. Patient left AMA. Explained to the patient that if this is a blood clot it can be serious and could go to her lung.   Final Clinical Impressions(s) / ED Diagnoses   Final diagnoses:  Right knee pain, unspecified chronicity    ED Discharge  Orders    None       Debroah Baller White, NP 01/14/18 1402    Pixie Casino, MD 01/14/18 [phone]

## 2018-01-14 NOTE — ED Triage Notes (Signed)
PT called staff to room and stated " I am sick and tired of waiting. I want to go home." [facility] N. NP in room to see Pt . Pt still reports she wants to go home.

## 2018-01-14 NOTE — ED Triage Notes (Signed)
Patient complains of 3 weeks. Pain with any ROM. Denies trauma. NAD

## 2018-01-31 ENCOUNTER — Encounter (HOSPITAL_COMMUNITY): Payer: Self-pay | Admitting: Emergency Medicine

## 2018-01-31 ENCOUNTER — Ambulatory Visit (HOSPITAL_COMMUNITY)
Admission: EM | Admit: 2018-01-31 | Discharge: 2018-01-31 | Disposition: A | Discharge status: Home / Self Care | Payer: Medicare Other | Attending: Family Medicine | Admitting: Family Medicine

## 2018-01-31 DIAGNOSIS — M25561 Pain in right knee: Secondary | ICD-10-CM | POA: Diagnosis not present

## 2018-01-31 DIAGNOSIS — G8929 Other chronic pain: Secondary | ICD-10-CM | POA: Diagnosis not present

## 2018-01-31 MED ORDER — METHYLPREDNISOLONE SODIUM SUCC 125 MG IJ SOLR
125.0000 mg | Freq: Once | INTRAMUSCULAR | Status: AC
  Administered 2018-01-31: 125 mg via INTRAMUSCULAR

## 2018-01-31 MED ORDER — METHYLPREDNISOLONE SODIUM SUCC 125 MG IJ SOLR
INTRAMUSCULAR | Status: AC
Start: 2018-01-31 — End: 2018-01-31
  Filled 2018-01-31: qty 2

## 2018-01-31 NOTE — Discharge Instructions (Signed)
Today you have received:  methylPREDNISolone sodium succinate  125 mg Intramuscular Once   Please keep your orthopaedic follow up that is being scheduled by your primary doctor. You may take over the counter Aleve twice daily with food in addition to Tylenol.

## 2018-01-31 NOTE — ED Provider Notes (Addendum)
[facility]   [id number] 01/31/18 Arrival Time: 3086  ASSESSMENT & PLAN:  1. Chronic pain of right knee   Arthritic related.  Meds ordered this encounter  Medications  . methylPREDNISolone sodium succinate (SOLU-MEDROL) 125 mg/2 mL injection 125 mg   Prefers OTC Aleve bid with food. Her PCP is referring to orthopaedist. May f/u here as needed.  Reviewed expectations re: course of current medical issues. Questions answered. Outlined signs and symptoms indicating need for more acute intervention. Patient verbalized understanding. After Visit Summary given.  SUBJECTIVE: History from: patient. Kelli Sandoval is a 72 y.o. female who reports mild to moderate pain of her right knee that is gradually worsening; intermittent; described as aching without radiation. Pain present for over one year. Injury/trama: reports multiple falls over the past year. Relieved by: nothing in particular. Worsened by: certain movements. Associated symptoms: none reported. Extremity sensation changes or weakness: none. Self treatment: Tylenol without much relief. "Just need a shot of steroids and that has helped."  ROS: As per HPI.   OBJECTIVE:  Vitals:   01/31/18 1023 01/31/18 1025  BP:  112/60  Pulse:  (!) 56  Resp:  18  Temp:  98.9 F (37.2 C)  SpO2:  99%  Weight: 176 lb (79.8 kg)     General appearance: alert; no distress Extremities: no cyanosis or edema; symmetrical with no gross deformities; diffuse tenderness over her right knee with no swelling and no bruising; ROM: normal CV: normal extremity capillary refill Skin: warm and dry Neurologic: normal gait; normal symmetric reflexes in all extremities; normal sensation in all extremities Psychological: alert and cooperative; normal mood and affect  No Known Allergies   Recent Imaging: CLINICAL DATA:  Worsening right knee pain since fall last December.  EXAM: RIGHT KNEE - COMPLETE 4+ VIEW  COMPARISON:   None.  FINDINGS: No acute fracture or dislocation. Mild tricompartmental joint space narrowing. No joint effusion. Osteopenia. Soft tissues are unremarkable.  IMPRESSION: 1. No acute osseous abnormality. Mild tricompartmental osteoarthritis.  Electronically Signed   By: Titus Dubin M.D.   On: 01/14/2018 10:59  Past Medical History:  Diagnosis Date  . Allergic rhinitis   . Colorectal polyps   . Hypertension   . Hypothyroidism    Social History   Socioeconomic History  . Marital status: Divorced    Spouse name: Not on file  . Number of children: Not on file  . Years of education: Not on file  . Highest education level: Not on file  Social Needs  . Financial resource strain: Not on file  . Food insecurity - worry: Not on file  . Food insecurity - inability: Not on file  . Transportation needs - medical: Not on file  . Transportation needs - non-medical: Not on file  Occupational History  . Not on file  Tobacco Use  . Smoking status: Current Some Day Smoker    Types: Cigarettes  . Smokeless tobacco: Never Used  . Tobacco comment: smokes 1 cigaratte "everyone once in a while"   Substance and Sexual Activity  . Alcohol use: No  . Drug use: No  . Sexual activity: Not on file  Other Topics Concern  . Not on file  Social History Narrative  . Not on file   Family History  Problem Relation Age of Onset  . Breast cancer Sister    Past Surgical History:  Procedure Laterality Date  . allergic rhinitis    . BREAST BIOPSY  2012  . COLONOSCOPY  10/13/2011   Procedure: COLONOSCOPY;  Surgeon: Missy Sabins, MD;  Location: [facility];  Service: Endoscopy;  Laterality: N/A;  . LUNG SURGERY  25 years ago   benign tumor      Vanessa Kick, MD 01/31/18 1042    Vanessa Kick, MD 01/31/18 1043

## 2018-01-31 NOTE — ED Triage Notes (Signed)
PT reports right knee pain for over a year. PT has had multiple falls. No gout history.

## 2018-02-13 ENCOUNTER — Ambulatory Visit: Payer: Self-pay

## 2018-02-13 ENCOUNTER — Ambulatory Visit
Admission: RE | Admit: 2018-02-13 | Discharge: 2018-02-13 | Disposition: A | Discharge status: Home / Self Care | Payer: Medicare Other | Source: Ambulatory Visit | Attending: Family Medicine | Admitting: Family Medicine

## 2018-02-13 DIAGNOSIS — Z1231 Encounter for screening mammogram for malignant neoplasm of breast: Secondary | ICD-10-CM

## 2019-01-14 ENCOUNTER — Encounter (HOSPITAL_COMMUNITY): Payer: Self-pay

## 2019-01-14 ENCOUNTER — Emergency Department (HOSPITAL_COMMUNITY)
Admission: EM | Admit: 2019-01-14 | Discharge: 2019-01-14 | Disposition: A | Discharge status: Home / Self Care | Payer: Medicare Other | Attending: Emergency Medicine | Admitting: Emergency Medicine

## 2019-01-14 ENCOUNTER — Emergency Department (HOSPITAL_COMMUNITY): Payer: Medicare Other

## 2019-01-14 DIAGNOSIS — S39012A Strain of muscle, fascia and tendon of lower back, initial encounter: Secondary | ICD-10-CM | POA: Diagnosis not present

## 2019-01-14 DIAGNOSIS — Y939 Activity, unspecified: Secondary | ICD-10-CM | POA: Diagnosis not present

## 2019-01-14 DIAGNOSIS — E039 Hypothyroidism, unspecified: Secondary | ICD-10-CM | POA: Diagnosis not present

## 2019-01-14 DIAGNOSIS — Y33XXXA Other specified events, undetermined intent, initial encounter: Secondary | ICD-10-CM | POA: Insufficient documentation

## 2019-01-14 DIAGNOSIS — F17210 Nicotine dependence, cigarettes, uncomplicated: Secondary | ICD-10-CM | POA: Insufficient documentation

## 2019-01-14 DIAGNOSIS — Y929 Unspecified place or not applicable: Secondary | ICD-10-CM | POA: Diagnosis not present

## 2019-01-14 DIAGNOSIS — S3992XA Unspecified injury of lower back, initial encounter: Secondary | ICD-10-CM | POA: Diagnosis present

## 2019-01-14 DIAGNOSIS — Z79899 Other long term (current) drug therapy: Secondary | ICD-10-CM | POA: Diagnosis not present

## 2019-01-14 DIAGNOSIS — Y998 Other external cause status: Secondary | ICD-10-CM | POA: Diagnosis not present

## 2019-01-14 DIAGNOSIS — I10 Essential (primary) hypertension: Secondary | ICD-10-CM | POA: Insufficient documentation

## 2019-01-14 MED ORDER — METHOCARBAMOL 750 MG PO TABS: 750.0000 mg | ORAL_TABLET | Freq: Three times a day (TID) | ORAL | 0 refills | Status: DC | PRN

## 2019-01-14 MED ORDER — IBUPROFEN 200 MG PO TABS
200.0000 mg | ORAL_TABLET | Freq: Once | ORAL | Status: AC
  Administered 2019-01-14: 200 mg via ORAL
  Filled 2019-01-14: qty 1

## 2019-01-14 MED ORDER — METHOCARBAMOL 500 MG PO TABS
750.0000 mg | ORAL_TABLET | Freq: Once | ORAL | Status: AC
  Administered 2019-01-14: 750 mg via ORAL
  Filled 2019-01-14: qty 2

## 2019-01-14 NOTE — ED Notes (Signed)
Patient verbalizes understanding of discharge instructions. Opportunity for questioning and answers were provided. Armband removed by staff, pt discharged from ED ambulatory to home.  

## 2019-01-14 NOTE — ED Provider Notes (Signed)
[facility] EMERGENCY DEPARTMENT Provider Note   CSN: [id number] Arrival date & time: 01/14/19  1444     History   Chief Complaint Chief Complaint  Patient presents with  . Back Pain    HPI Kelli Sandoval is a 73 y.o. female.  Patient c/o acute onset low back pain today. States she felt she bent/stepped funny, felt a pull/pop in low back with onset pain. Pain lower lumbar area, radiates to posterior legs. No leg numbness/weakness. No saddle anesthesia. No bowel or urine retention, or incontinence. No fever or chills. Denies abd/flank pain. States hx low back trouble. Denies prior back surgery. Took acetaminophen with mild, incomplete relief of pain.   The history is provided by the patient.  Back Pain  Associated symptoms: no abdominal pain, no chest pain, no dysuria, no fever and no headaches     Past Medical History:  Diagnosis Date  . Allergic rhinitis   . Colorectal polyps   . Hypertension   . Hypothyroidism     Patient Active Problem List   Diagnosis Date Noted  . Iron deficiency anemia 08/03/2017  . GASTROENTERITIS 01/06/2011  . HYPERLIPIDEMIA 11/10/2010  . VITAMIN D DEFICIENCY 10/06/2009  . VAGINITIS, CANDIDAL 10/03/2009  . TRICHOMONAL VAGINITIS 10/03/2009  . SHOULDER PAIN, RIGHT 06/16/2009  . MUSCLE SPASM, TRAPEZIUS MUSCLE, RIGHT 04/28/2009  . GASTROENTERITIS, HX OF 01/22/2009  . ESSENTIAL HYPERTENSION, BENIGN 01/30/2008  . DEGENERATIVE JOINT DISEASE, RIGHT KNEE 01/30/2008  . 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 ALLERGIC RHINITIS 07/31/2007  . BURSITIS NOS 07/31/2007  . HYPOTHYROIDISM, UNSPECIFIED 02/02/2007  . TOBACCO DEPENDENCE 02/02/2007  . MIGRAINE, UNSPEC., W/O INTRACTABLE MIGRAINE 02/02/2007  . GLAUCOMA 02/02/2007  . CATARACT 02/02/2007  . COPD 02/02/2007  . BLOOD IN STOOL, MELENA 02/02/2007  . INCONTINENCE, STRESS, FEMALE 02/02/2007  . MENOPAUSAL SYNDROME 02/02/2007    Past Surgical History:  Procedure Laterality  Date  . allergic rhinitis    . BREAST BIOPSY Left 2012   benign  . COLONOSCOPY  10/13/2011   Procedure: COLONOSCOPY;  Surgeon: Missy Sabins, MD;  Location: [facility];  Service: Endoscopy;  Laterality: N/A;  . LUNG SURGERY  25 years ago   benign tumor     OB History   No obstetric history on file.      Home Medications    Prior to Admission medications   Medication Sig Start Date End Date Taking? Authorizing Provider  acetaminophen (TYLENOL) 325 MG tablet Take 650 mg by mouth every 6 (six) hours as needed.    [provider]  fish oil-omega-3 fatty acids 1000 MG capsule Take 1 g by mouth 2 (two) times daily.     [provider]  fluticasone (FLONASE) 50 MCG/ACT nasal spray Place 2 sprays into both nostrils daily. 08/19/14   Pisciotta, Elmyra Ricks, PA-C  levothyroxine (SYNTHROID, LEVOTHROID) 25 MCG tablet Take 25 mcg by mouth daily.      [provider]  loratadine (CLARITIN) 10 MG tablet Take 10 mg by mouth daily.    [provider]  meloxicam (MOBIC) 7.5 MG tablet Take 1 tablet (7.5 mg total) by mouth daily as needed for pain. 09/24/15   Clayton Bibles, PA-C  Simethicone (GAS RELIEF 80 PO) Take by mouth.    [provider]  simvastatin (ZOCOR) 20 MG tablet Take 20 mg by mouth daily. 02/12/14   [provider]  traMADol (ULTRAM) 50 MG tablet Take 1 tablet (50 mg total) by mouth every 12 (twelve) hours as needed for  moderate pain or severe pain. 09/24/15   Clayton Bibles, PA-C  venlafaxine (EFFEXOR-XR) 75 MG 24 hr capsule Take 75 mg by mouth daily.      [provider]    Family History Family History  Problem Relation Age of Onset  . Breast cancer Sister     Social History Social History   Tobacco Use  . Smoking status: Current Some Day Smoker    Types: Cigarettes  . Smokeless tobacco: Never Used  . Tobacco comment: smokes 1 cigaratte "everyone once in a while"   Substance Use Topics  . Alcohol use: No  . Drug use: No       Allergies   Patient has no known allergies.   Review of Systems Review of Systems  Constitutional: Negative for fever.  HENT: Negative for sore throat.   Eyes: Negative for redness.  Respiratory: Negative for cough and shortness of breath.   Cardiovascular: Negative for chest pain.  Gastrointestinal: Negative for abdominal pain, nausea and vomiting.  Genitourinary: Negative for dysuria, flank pain and hematuria.  Musculoskeletal: Positive for back pain. Negative for neck pain.  Skin: Negative for rash.  Neurological: Negative for headaches.  Hematological: Does not bruise/bleed easily.  Psychiatric/Behavioral: Negative for confusion.     Physical Exam Updated Vital Signs BP (!) 115/53   Pulse 60   Temp 98.2 F (36.8 C) (Oral)   Resp 18   SpO2 98%   Physical Exam Vitals signs and nursing note reviewed.  Constitutional:      Appearance: Normal appearance. She is well-developed.  HENT:     Head: Atraumatic.     Nose: Nose normal.     Mouth/Throat:     Mouth: Mucous membranes are moist.  Eyes:     General: No scleral icterus.    Conjunctiva/sclera: Conjunctivae normal.     Pupils: Pupils are equal, round, and reactive to light.  Neck:     Musculoskeletal: Normal range of motion and neck supple. No neck rigidity or muscular tenderness.     Trachea: No tracheal deviation.  Cardiovascular:     Rate and Rhythm: Normal rate and regular rhythm.     Pulses: Normal pulses.     Heart sounds: Normal heart sounds. No murmur. No friction rub. No gallop.   Pulmonary:     Effort: Pulmonary effort is normal. No respiratory distress.     Breath sounds: Normal breath sounds.  Abdominal:     General: Bowel sounds are normal. There is no distension.     Palpations: Abdomen is soft. There is no mass.     Tenderness: There is no abdominal tenderness. There is no guarding.     Comments: No pulsatile mass.   Genitourinary:    Comments: No cva tenderness.  Musculoskeletal:         General: No swelling.     Comments: Lower lumbar tenderness, otherwise, CTLS spine, non tender, aligned, no step off. Good rom bil hips and knees without pain. Distal pulses palp bil.   Skin:    General: Skin is warm and dry.     Findings: No rash.  Neurological:     Mental Status: She is alert.     Comments: Alert, speech normal. Motor intact bil legs, stre 5/5. sens grossly intact. Steady gait.   Psychiatric:        Mood and Affect: Mood normal.      ED Treatments / Results  Labs (all labs ordered are listed, but only abnormal results  are displayed) Labs Reviewed - No data to display  EKG None  Radiology Dg Lumbar Spine Complete  Result Date: 01/14/2019 CLINICAL DATA:  Back pain EXAM: LUMBAR SPINE - COMPLETE 4+ VIEW COMPARISON:  12/08/2014 FINDINGS: Five lumbar-type vertebral bodies. Mild degenerative changes at L4-5, new from 2016. Stable grade 1 spondylolisthesis at L5-S1, chronic. Vertebral body heights are maintained. Visualized bony pelvis appears intact. Cholecystectomy clips. IMPRESSION: Mild degenerative changes at L4-5, new from 2016. Stable grade 1 spondylolisthesis at L5-S1, chronic. Electronically Signed   By: Julian Hy M.D.   On: 01/14/2019 18:41    Procedures Procedures (including critical care time)  Medications Ordered in ED Medications  ibuprofen (ADVIL,MOTRIN) tablet 200 mg (has no administration in time range)  methocarbamol (ROBAXIN) tablet 750 mg (has no administration in time range)     Initial Impression / Assessment and Plan / ED Course  I have reviewed the triage vital signs and the nursing notes.  Pertinent labs & imaging results that were available during my care of the patient were reviewed by me and considered in my medical decision making (see chart for details).  Confirmed nkda w pt.   Pt has ride, did not drive.   Ibuprofen po. Robaxin po.   Imaging.  Reviewed nursing notes and prior charts for additional history.    Recheck pain improved.  xrays reviewed - no fx.   Pts pain improved and appears stable for d/c.   Final Clinical Impressions(s) / ED Diagnoses   Final diagnoses:  None    ED Discharge Orders    None       Lajean Saver, MD 01/14/19 [phone]

## 2019-01-14 NOTE — ED Notes (Signed)
Pt was ambulatory from wheelchair to stretcher at this time

## 2019-01-14 NOTE — ED Triage Notes (Signed)
Pt reports she pulled 2 muscles in her back years ago (2014) while lifting a heavy table. Pt reports she has had problems with back pain ever since and it got worse this morning. Pain radiates to the back of her right thigh. Taking tylenol at home for pain.

## 2019-01-14 NOTE — Discharge Instructions (Addendum)
It was our pleasure to provide your ER care today - we hope that you feel better.  Take acetaminophen and/or ibuprofen as need for pain.  You may also take robaxin as need for muscle spasm/pain - no driving when taking.  Follow up with primary care doctor in 1 week if symptoms fail to improve/resolve.  Return to ER if worse, new symptoms, fevers, intractable pain, leg numbness/weakness, other concern.

## 2019-01-29 ENCOUNTER — Other Ambulatory Visit: Payer: Self-pay | Admitting: Family Medicine

## 2019-01-29 DIAGNOSIS — Z1231 Encounter for screening mammogram for malignant neoplasm of breast: Secondary | ICD-10-CM

## 2019-03-01 ENCOUNTER — Ambulatory Visit: Payer: Self-pay

## 2019-04-04 ENCOUNTER — Ambulatory Visit: Payer: Self-pay

## 2019-05-01 ENCOUNTER — Ambulatory Visit
Admission: RE | Admit: 2019-05-01 | Discharge: 2019-05-01 | Disposition: A | Discharge status: Home / Self Care | Payer: Medicare Other | Source: Ambulatory Visit | Attending: Family Medicine | Admitting: Family Medicine

## 2019-05-01 ENCOUNTER — Other Ambulatory Visit: Payer: Self-pay

## 2019-05-01 DIAGNOSIS — Z1231 Encounter for screening mammogram for malignant neoplasm of breast: Secondary | ICD-10-CM

## 2019-05-29 ENCOUNTER — Ambulatory Visit: Payer: Medicare Other

## 2019-07-18 ENCOUNTER — Other Ambulatory Visit: Payer: Self-pay | Admitting: Family Medicine

## 2019-07-18 DIAGNOSIS — M8580 Other specified disorders of bone density and structure, unspecified site: Secondary | ICD-10-CM

## 2019-07-31 ENCOUNTER — Ambulatory Visit
Admission: RE | Admit: 2019-07-31 | Discharge: 2019-07-31 | Disposition: A | Discharge status: Home / Self Care | Payer: Medicare Other | Source: Ambulatory Visit | Attending: Family Medicine | Admitting: Family Medicine

## 2019-07-31 ENCOUNTER — Other Ambulatory Visit: Payer: Self-pay

## 2019-07-31 DIAGNOSIS — M8580 Other specified disorders of bone density and structure, unspecified site: Secondary | ICD-10-CM

## 2019-08-22 ENCOUNTER — Encounter: Payer: Self-pay | Admitting: Cardiology

## 2019-08-28 NOTE — H&P (Signed)
Patient's anticipated LOS is less than 2 midnights, meeting these requirements: - Younger than 27 - Lives within 1 hour of care - Has a competent adult at home to recover with post-op recover - NO history of  - Chronic pain requiring opiods  - Diabetes  - Coronary Artery Disease  - Heart failure  - Heart attack  - Stroke  - DVT/VTE  - Cardiac arrhythmia  - Respiratory Failure/COPD  - Renal failure  - Anemia  - Advanced Liver disease       Kelli Sandoval is an 73 y.o. female.    Chief Complaint: right knee pain  HPI: Pt is a 73 y.o. female complaining of right knee pain for multiple years. Pain had continually increased since the beginning. X-rays in the clinic show end-stage arthritic changes of the right knee. Pt has tried various conservative treatments which have failed to alleviate their symptoms, including injections and therapy. Various options are discussed with the patient. Risks, benefits and expectations were discussed with the patient. Patient understand the risks, benefits and expectations and wishes to proceed with surgery.   PCP:  Donald Prose, MD  D/C Plans: Home  PMH: Past Medical History:  Diagnosis Date  . Allergic rhinitis   . Colorectal polyps   . Hypertension   . Hypothyroidism     PSH: Past Surgical History:  Procedure Laterality Date  . allergic rhinitis    . BREAST BIOPSY Left 2012   benign  . COLONOSCOPY  10/13/2011   Procedure: COLONOSCOPY;  Surgeon: Missy Sabins, MD;  Location: [facility];  Service: Endoscopy;  Laterality: N/A;  . LUNG SURGERY  25 years ago   benign tumor    Social History:  reports that she has been smoking cigarettes. She has never used smokeless tobacco. She reports that she does not drink alcohol or use drugs.  Allergies:  No Known Allergies  Medications: No current facility-administered medications for this encounter.    Current Outpatient Medications  Medication Sig Dispense Refill  . acetaminophen  (TYLENOL) 325 MG tablet Take 650 mg by mouth every 6 (six) hours as needed for mild pain.     . Ascorbic Acid (VITAMIN C PO) Take 1 tablet by mouth daily.    [name] Kitchen aspirin EC 81 MG tablet Take 81 mg by mouth daily.    . dorzolamide-timolol (COSOPT) 22.3-6.8 MG/ML ophthalmic solution Place 1 drop into both eyes 2 (two) times daily.    . fish oil-omega-3 fatty acids 1000 MG capsule Take 1 g by mouth 2 (two) times daily.     . fluticasone (FLONASE) 50 MCG/ACT nasal spray Place 2 sprays into both nostrils daily. (Patient not taking: Reported on 01/14/2019) 16 g 0  . hydrochlorothiazide (HYDRODIURIL) 25 MG tablet Take 25 mg by mouth daily.    [name] Kitchen latanoprost (XALATAN) 0.005 % ophthalmic solution Place 1 drop into both eyes at bedtime.    [name] Kitchen levothyroxine (SYNTHROID, LEVOTHROID) 88 MCG tablet Take 88 mcg by mouth daily.    [name] Kitchen loratadine (CLARITIN) 10 MG tablet Take 10 mg by mouth daily.    . meloxicam (MOBIC) 7.5 MG tablet Take 1 tablet (7.5 mg total) by mouth daily as needed for pain. (Patient not taking: Reported on 01/14/2019) 10 tablet 0  . methocarbamol (ROBAXIN) 750 MG tablet Take 1 tablet (750 mg total) by mouth 3 (three) times daily as needed (muscle spasm/pain). 15 tablet 0  . traMADol (ULTRAM) 50 MG tablet Take 1 tablet (50 mg total) by mouth every  12 (twelve) hours as needed for moderate pain or severe pain. (Patient not taking: Reported on 01/14/2019) 10 tablet 0  . VITAMIN D PO Take 1 tablet by mouth daily.      No results found for this or any previous visit (from the past 48 hour(s)). No results found.  ROS: Pain with rom of the right lower extremity  Physical Exam: Alert and oriented 73 y.o. female in no acute distress Cranial nerves 2-12 intact Cervical spine: full rom with no tenderness, nv intact distally Chest: active breath sounds bilaterally, no wheeze rhonchi or rales Heart: regular rate and rhythm, no murmur Abd: non tender non distended with active bowel sounds Hip is stable with  rom  Right knee pain with medial and lateral joint line tenderness nv intact distally Antalgic gait No rashes or edema distally  Assessment/Plan Assessment: right knee end stage osteoarthritis  Plan:  Patient will undergo a right total knee by Dr. Veverly Fells at [facility]. Risks benefits and expectations were discussed with the patient. Patient understand risks, benefits and expectations and wishes to proceed. Preoperative templating of the joint replacement has been completed, documented, and submitted to the Operating Room personnel in order to optimize intra-operative equipment management.   Merla Riches PA-C, MPAS [facility] is now [employer] [address], [place], [place], [place] [address] Phone: [phone] [email or web address] Facebook  [employer]

## 2019-09-03 ENCOUNTER — Other Ambulatory Visit (HOSPITAL_COMMUNITY): Payer: Self-pay | Admitting: *Deleted

## 2019-09-03 NOTE — Patient Instructions (Addendum)
DUE TO COVID-19 ONLY ONE VISITOR IS ALLOWED TO COME WITH YOU AND STAY IN THE WAITING ROOM ONLY DURING PRE OP AND PROCEDURE DAY OF SURGERY. THE 1 VISITOR MAY VISIT WITH YOU AFTER SURGERY IN YOUR PRIVATE ROOM DURING VISITING HOURS ONLY!  YOU NEED TO HAVE A COVID 19 TEST ON 09-11-2019 AT 10am, THIS TEST MUST BE DONE BEFORE SURGERY. COME TO [place] , [id number].  [facility]). ONCE YOUR COVID TEST IS COMPLETED, PLEASE BEGIN THE QUARANTINE INSTRUCTIONS AS OUTLINED IN YOUR HANDOUT.                Kelli Sandoval    Your procedure is scheduled on: 09-14-2019   Report to [facility] Main  Entrance   Report to  SHORT STAY at 530  AM     Call this number if you have problems the morning of surgery [phone]    Remember: [place], NO CHEWING GUM [place].   NO SOLID FOOD AFTER MIDNIGHT THE NIGHT PRIOR TO SURGERY. NOTHING BY MOUTH EXCEPT CLEAR LIQUIDS UNTIL 430 AM . PLEASE FINISH G2 DRINK PER SURGEON ORDER  WHICH NEEDS TO BE COMPLETED AT 430 AM .   CLEAR LIQUID DIET   Foods Allowed                                                                     Foods Excluded  Coffee and tea, regular and decaf                             liquids that you cannot  Plain Jell-O any favor except red or purple                                           see through such as: Fruit ices (not with fruit pulp)                                     milk, soups, orange juice  Iced Popsicles                                    All solid food Carbonated beverages, regular and diet                                    Cranberry, grape and apple juices Sports drinks like Gatorade Lightly seasoned clear broth or consume(fat free) Sugar, honey syrup  Sample Menu Breakfast                                Lunch  Supper Cranberry juice                    Beef broth                             Chicken broth Jell-O                                     Grape juice                           Apple juice Coffee or tea                        Jell-O                                      Popsicle                                                Coffee or tea                        Coffee or tea  _____________________________________________________________________     Take these medicines the morning of surgery with A SIP OF WATER: LORATADINE (CLARTITIN), SIMVASTATIN (LIPITOR), EYE DROP, LEVOTHYROXINE (SYNTHROID)  DO NOT TAKE ANY DIABETIC MEDICATIONS DAY OF YOUR SURGERY     How to Manage Your Diabetes Before and After Surgery  Why is it important to control my blood sugar before and after surgery? . Improving blood sugar levels before and after surgery helps healing and can limit problems. . A way of improving blood sugar control is eating a healthy diet by: o  Eating less sugar and carbohydrates o  Increasing activity/exercise o  Talking with your doctor about reaching your blood sugar goals . High blood sugars (greater than 180 mg/dL) can raise your risk of infections and slow your recovery, so you will need to focus on controlling your diabetes during the weeks before surgery. . Make sure that the doctor who takes care of your diabetes knows about your planned surgery including the date and location.  How do I manage my blood sugar before surgery? . Check your blood sugar at least 4 times a day, starting 2 days before surgery, to make sure that the level is not too high or low. o Check your blood sugar the morning of your surgery when you wake up and every 2 hours until you get to the Short Stay unit. . If your blood sugar is less than 70 mg/dL, you will need to treat for low blood sugar: o Do not take insulin. o Treat a low blood sugar (less than 70 mg/dL) with  cup of clear juice (cranberry or apple), 4 glucose tablets, OR glucose gel. o Recheck blood sugar in 15 minutes after  treatment (to make sure it is greater than 70 mg/dL). If your blood sugar is not greater than 70 mg/dL on recheck, call [phone] for further instructions. . Report your blood sugar to the short stay nurse when you get to Short Stay.  [name] Kitchen  If you are admitted to the hospital after surgery: o Your blood sugar will be checked by the staff and you will probably be given insulin after surgery (instead of oral diabetes medicines) to make sure you have good blood sugar levels. o The goal for blood sugar control after surgery is 80-180 mg/dL.   WHAT DO I DO ABOUT MY DIABETES MEDICATION?  [name] Kitchen Do not take oral diabetes medicines (pills) the morning of surgery.  . THE  DAY  BEFORE SURGERY TAKE METFORMIN AS USUAL.       . THE MORNING OF SURGERY DO NOT TAKE METFORMIN. [name] Kitchen                 You may not have any metal on your body including hair pins and              piercings  Do not wear jewelry, make-up, lotions, powders or perfumes, deodorant             Do not wear nail polish on your fingernails.  Do not shave  48 hours prior to surgery.              Do not bring valuables to the hospital. [facility].  Contacts, dentures or bridgework may not be worn into surgery.  Leave suitcase in the car. After surgery it may be brought to your room.     _____________________________________________________________________             [facility] - Preparing for Surgery Before surgery, you can play an important role.  Because skin is not sterile, your skin needs to be as free of germs as possible.  You can reduce the number of germs on your skin by washing with CHG (chlorahexidine gluconate) soap before surgery.  CHG is an antiseptic cleaner which kills germs and bonds with the skin to continue killing germs even after washing. Please DO NOT use if you have an allergy to CHG or antibacterial soaps.  If your skin becomes reddened/irritated stop using the CHG and  inform your nurse when you arrive at Short Stay. Do not shave (including legs and underarms) for at least 48 hours prior to the first CHG shower.  You may shave your face/neck. Please follow these instructions carefully:  1.  Shower with CHG Soap the night before surgery and the  morning of Surgery.  2.  If you choose to wash your hair, wash your hair first as usual with your  normal  shampoo.  3.  After you shampoo, rinse your hair and body thoroughly to remove the  shampoo.                           4.  Use CHG as you would any other liquid soap.  You can apply chg directly  to the skin and wash                       Gently with a scrungie or clean washcloth.  5.  Apply the CHG Soap to your body ONLY FROM THE NECK DOWN.   Do not use on face/ open                           Wound or open sores. Avoid contact with eyes,  ears mouth and genitals (private parts).                       Wash face,  Genitals (private parts) with your normal soap.             6.  Wash thoroughly, paying special attention to the area where your surgery  will be performed.  7.  Thoroughly rinse your body with warm water from the neck down.  8.  DO NOT shower/wash with your normal soap after using and rinsing off  the CHG Soap.                9.  Pat yourself dry with a clean towel.            10.  Wear clean pajamas.            11.  Place clean sheets on your bed the night of your first shower and do not  sleep with pets. Day of Surgery : Do not apply any lotions/deodorants the morning of surgery.  Please wear clean clothes to the hospital/surgery center.  FAILURE TO FOLLOW THESE INSTRUCTIONS MAY RESULT IN THE CANCELLATION OF YOUR SURGERY PATIENT SIGNATURE_________________________________  NURSE SIGNATURE__________________________________  ________________________________________________________________________   Kelli Sandoval  An incentive spirometer is a tool that can help keep your lungs clear and  active. This tool measures how well you are filling your lungs with each breath. Taking long deep breaths may help reverse or decrease the chance of developing breathing (pulmonary) problems (especially infection) following:  A long period of time when you are unable to move or be active. BEFORE THE PROCEDURE   If the spirometer includes an indicator to show your best effort, your nurse or respiratory therapist will set it to a desired goal.  If possible, sit up straight or lean slightly forward. Try not to slouch.  Hold the incentive spirometer in an upright position. INSTRUCTIONS FOR USE  1. Sit on the edge of your bed if possible, or sit up as far as you can in bed or on a chair. 2. Hold the incentive spirometer in an upright position. 3. Breathe out normally. 4. Place the mouthpiece in your mouth and seal your lips tightly around it. 5. Breathe in slowly and as deeply as possible, raising the piston or the ball toward the top of the column. 6. Hold your breath for 3-5 seconds or for as long as possible. Allow the piston or ball to fall to the bottom of the column. 7. Remove the mouthpiece from your mouth and breathe out normally. 8. Rest for a few seconds and repeat Steps 1 through 7 at least 10 times every 1-2 hours when you are awake. Take your time and take a few normal breaths between deep breaths. 9. The spirometer may include an indicator to show your best effort. Use the indicator as a goal to work toward during each repetition. 10. After each set of 10 deep breaths, practice coughing to be sure your lungs are clear. If you have an incision (the cut made at the time of surgery), support your incision when coughing by placing a pillow or rolled up towels firmly against it. Once you are able to get out of bed, walk around indoors and cough well. You may stop using the incentive spirometer when instructed by your caregiver.  RISKS AND COMPLICATIONS  Take your time so you do not get  dizzy or light-headed.  If you are  in pain, you may need to take or ask for pain medication before doing incentive spirometry. It is harder to take a deep breath if you are having pain. AFTER USE  Rest and breathe slowly and easily.  It can be helpful to keep track of a log of your progress. Your caregiver can provide you with a simple table to help with this. If you are using the spirometer at home, follow these instructions: [place] IF:   You are having difficultly using the spirometer.  You have trouble using the spirometer as often as instructed.  Your pain medication is not giving enough relief while using the spirometer.  You develop fever of 100.5 F (38.1 C) or higher. SEEK IMMEDIATE MEDICAL CARE IF:   You cough up bloody sputum that had not been present before.  You develop fever of 102 F (38.9 C) or greater.  You develop worsening pain at or near the incision site. MAKE SURE YOU:   Understand these instructions.  Will watch your condition.  Will get help right away if you are not doing well or get worse. Document Released: 04/04/2007 Document Revised: 02/14/2012 Document Reviewed: 06/05/2007 [facility] Patient Information 2014 [place], [place].   ________________________________________________________________________

## 2019-09-05 ENCOUNTER — Encounter (HOSPITAL_COMMUNITY): Payer: Self-pay

## 2019-09-05 ENCOUNTER — Encounter (HOSPITAL_COMMUNITY)
Admission: RE | Admit: 2019-09-05 | Discharge: 2019-09-05 | Disposition: A | Discharge status: Home / Self Care | Payer: Medicare Other | Source: Ambulatory Visit | Attending: Orthopedic Surgery | Admitting: Orthopedic Surgery

## 2019-09-05 ENCOUNTER — Other Ambulatory Visit: Payer: Self-pay

## 2019-09-05 DIAGNOSIS — M1711 Unilateral primary osteoarthritis, right knee: Secondary | ICD-10-CM | POA: Diagnosis not present

## 2019-09-05 DIAGNOSIS — Z01812 Encounter for preprocedural laboratory examination: Secondary | ICD-10-CM | POA: Insufficient documentation

## 2019-09-05 LAB — BASIC METABOLIC PANEL
Anion gap: 11 (ref 5–15)
BUN: 6 mg/dL — ABNORMAL LOW (ref 8–23)
CO2: 27 mmol/L (ref 22–32)
Calcium: 9.5 mg/dL (ref 8.9–10.3)
Chloride: 94 mmol/L — ABNORMAL LOW (ref 98–111)
Creatinine, Ser: 0.76 mg/dL (ref 0.44–1.00)
GFR calc Af Amer: >60 mL/min (ref >60)
GFR calc non Af Amer: >60 mL/min (ref >60)
Glucose, Bld: 131 mg/dL — ABNORMAL HIGH (ref 70–99)
Potassium: 3.4 mmol/L — ABNORMAL LOW (ref 3.5–5.1)
Sodium: 132 mmol/L — ABNORMAL LOW (ref 135–145)

## 2019-09-05 LAB — CBC
HCT: 38.8 % (ref 36.0–46.0)
Hemoglobin: 12.5 g/dL (ref 12.0–15.0)
MCH: 27.7 pg (ref 26.0–34.0)
MCHC: 32.2 g/dL (ref 30.0–36.0)
MCV: 85.8 fL (ref 80.0–100.0)
Platelets: 459 10*3/uL — ABNORMAL HIGH (ref 150–400)
RBC: 4.52 MIL/uL (ref 3.87–5.11)
RDW: 14.1 % (ref 11.5–15.5)
WBC: 6.4 10*3/uL (ref 4.0–10.5)
nRBC: 0 % (ref 0.0–0.2)

## 2019-09-05 LAB — SURGICAL PCR SCREEN
MRSA, PCR: NEGATIVE
Staphylococcus aureus: NEGATIVE

## 2019-09-05 LAB — HEMOGLOBIN A1C
Hgb A1c MFr Bld: 7.3 % — ABNORMAL HIGH (ref 4.8–5.6)
Mean Plasma Glucose: 162.81 mg/dL

## 2019-09-05 LAB — GLUCOSE, CAPILLARY: Glucose-Capillary: 186 mg/dL — ABNORMAL HIGH (ref 70–99)

## 2019-09-05 MED ORDER — CHLORHEXIDINE GLUCONATE 4 % EX LIQD: 60.0000 mL | Freq: Once | CUTANEOUS | Status: DC

## 2019-09-05 NOTE — Progress Notes (Signed)
PCP - Dr. Donald Prose Cardiologist - Dr. Buford Dresser  Chest x-ray -  EKG - 08/22/19 Stress Test -  ECHO -  Cardiac Cath -   Sleep Study -  CPAP -   Fasting Blood Sugar - 186 Checks Blood Sugar __0___ times a day  Blood Thinner Instructions: instructed to call Dr. Veverly Fells office Aspirin Instructions: Last Dose:  Anesthesia review:   Patient denies shortness of breath, fever, cough and chest pain at PAT appointment   Patient verbalized understanding of instructions that were given to them at the PAT appointment. Patient was also instructed that they will need to review over the PAT instructions again at home before surgery.

## 2019-09-05 NOTE — Progress Notes (Signed)
Cardiac clearance appointment rescheduled for Friday Oct 2 at 2pm, [address].  Pt aware. Coolidge Breeze, RN 09/05/2019

## 2019-09-07 ENCOUNTER — Other Ambulatory Visit: Payer: Self-pay

## 2019-09-07 ENCOUNTER — Ambulatory Visit (INDEPENDENT_AMBULATORY_CARE_PROVIDER_SITE_OTHER): Payer: Medicare Other | Admitting: Cardiology

## 2019-09-07 ENCOUNTER — Encounter: Payer: Self-pay | Admitting: Cardiology

## 2019-09-07 VITALS — BP 104/62 | HR 69 | Ht 69.0 in | Wt 181.4 lb

## 2019-09-07 DIAGNOSIS — Z7189 Other specified counseling: Secondary | ICD-10-CM | POA: Diagnosis not present

## 2019-09-07 DIAGNOSIS — Z716 Tobacco abuse counseling: Secondary | ICD-10-CM | POA: Diagnosis not present

## 2019-09-07 DIAGNOSIS — R9431 Abnormal electrocardiogram [ECG] [EKG]: Secondary | ICD-10-CM | POA: Diagnosis not present

## 2019-09-07 DIAGNOSIS — F17210 Nicotine dependence, cigarettes, uncomplicated: Secondary | ICD-10-CM

## 2019-09-07 DIAGNOSIS — Z01818 Encounter for other preprocedural examination: Secondary | ICD-10-CM | POA: Diagnosis not present

## 2019-09-07 NOTE — Patient Instructions (Signed)
Medication Instructions:  Your Physician recommend you continue on your current medication as directed.    If you need a refill on your cardiac medications before your next appointment, please call your pharmacy.   Lab work: None  Testing/Procedures: None  Follow-Up: At [facility], you and your health needs are our priority.  As part of our continuing mission to provide you with exceptional heart care, we have created designated Provider Care Teams.  These Care Teams include your primary Cardiologist (physician) and Advanced Practice Providers (APPs -  Physician Assistants and Nurse Practitioners) who all work together to provide you with the care you need, when you need it. You will need a follow up appointment as needed   Please call our office 2 months in advance to schedule this appointment.  You may see Dr. Louretta Parma or one of the following Advanced Practice Providers on your designated Care Team:   Rosaria Ferries, PA-C . Jory Sims, DNP, ANP

## 2019-09-07 NOTE — Progress Notes (Signed)
Cardiology Office Note:    Date:  09/07/2019   ID:  Kelli Sandoval, DOB [birth year], MRN [id number]  PCP:  Donald Prose, MD  Cardiologist:  Buford Dresser, MD  Referring MD: Donald Prose, MD   CC: preoperative cardiovascular exam  History of Present Illness:    Kelli Sandoval is a 73 y.o. female with a hx of HTN, type II diabetes on metformin who is seen as a new consult at the request of Donald Prose, MD for the evaluation and management of cardiovascular preoperative evaluation.  Per review of the notes, she was seen by Dr. Nancy Fetter on 08/22/19 for preoperative evaluation. 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 I do not have access to nor was a copy of the ECG included in referral notes. Reviewed ECG performed in office today, below  Preoperative cardiovascular evaluation  Planned surgery: total knee arthroplasty 09/14/19 with Dr. Veverly Fells  Pertinent past cardiac history: none Prior cardiac workup: none History of valve disease: none History of CAD/PAD/CVA/TIA: none History of heart failure: none History of arrhythmia: none On anticoagulation: no anticoagulation, is on baby aspirin Additional history: hypertension, type II diabetes on metformin. Denies other history, including CKD, OSA. Has had prior surgeries without complications to anesthesia.  Functional capacity: able to walk slowly without issue. Climbs stairs routinely.   Denies chest pain, shortness of breath at rest or with normal exertion. No PND, orthopnea, LE edema or unexpected weight gain. No syncope or palpitations.  Rare smoker, mostly with stress. Just lost her oldest sister, only two (herself and sister) still alive out of 9 children). Trying to quit.   Past Medical History:  Diagnosis Date  . Allergic rhinitis   . Colorectal polyps   . Hypertension   . Hypothyroidism     Past Surgical History:  Procedure Laterality Date  . allergic rhinitis    . BREAST BIOPSY Left 2012   benign  . COLONOSCOPY  10/13/2011   Procedure: COLONOSCOPY;  Surgeon: Missy Sabins, MD;  Location: [facility];  Service: Endoscopy;  Laterality: N/A;  . LUNG SURGERY  25 years ago   benign tumor    Current Medications: Current Outpatient Medications on File Prior to Visit  Medication Sig  . acetaminophen (TYLENOL) 650 MG CR tablet Take 650-1,300 mg by mouth every 8 (eight) hours as needed (shoulder pain/pain.).  [name] Kitchen Ascorbic Acid (VITAMIN C GUMMIES PO) Take 2 tablets by mouth daily.  [name] Kitchen aspirin EC 81 MG tablet Take 81 mg by mouth daily.  . cholecalciferol (VITAMIN D) 25 MCG (1000 UT) tablet Take 1,000 Units by mouth daily.  . dorzolamide-timolol (COSOPT) 22.3-6.8 MG/ML ophthalmic solution Place 1 drop into both eyes 2 (two) times daily.  . fish oil-omega-3 fatty acids 1000 MG capsule Take 1 g by mouth 2 (two) times daily.   . hydrochlorothiazide (HYDRODIURIL) 25 MG tablet Take 25 mg by mouth daily.  [name] Kitchen latanoprost (XALATAN) 0.005 % ophthalmic solution Place 1 drop into both eyes at bedtime.  [name] Kitchen levothyroxine (SYNTHROID, LEVOTHROID) 88 MCG tablet Take 88 mcg by mouth daily.  [name] Kitchen loratadine (CLARITIN) 10 MG tablet Take 10 mg by mouth daily.  . metFORMIN (GLUCOPHAGE-XR) 500 MG 24 hr tablet Take 500 mg by mouth 3 (three) times daily after meals.  . simvastatin (ZOCOR) 20 MG tablet Take 20 mg by mouth daily.    No current facility-administered medications on file prior to visit.      Allergies:   Patient has no known allergies.  Social History   Tobacco Use  . Smoking status: Current Some Day Smoker    Types: Cigarettes  . Smokeless tobacco: Never Used  . Tobacco comment: smokes 1 cigaratte "everyone once in a while"   Substance Use Topics  . Alcohol use: No  . Drug use: No    Family History: family history includes Breast cancer in her sister.  ROS:   Please see the history of present illness.  Additional pertinent ROS: Constitutional: Negative for chills, fever, night sweats,  unintentional weight loss  HENT: Negative for ear pain and hearing loss.   Eyes: Negative for loss of vision and eye pain.  Respiratory: Negative for cough, sputum, wheezing.   Cardiovascular: See HPI. Gastrointestinal: Negative for abdominal pain, melena, and hematochezia.  Genitourinary: Negative for dysuria and hematuria.  Musculoskeletal: Negative for falls and myalgias. Has chronic back and knee pain. Skin: Negative for itching. Has a rash on her face, being seen by dermatology Neurological: Negative for focal weakness, focal sensory changes and loss of consciousness.  Endo/Heme/Allergies: Does not bruise/bleed easily.     EKGs/Labs/Other Studies Reviewed:    The following studies were reviewed today: No prior cardiac studies  EKG:  EKG is personally reviewed.  The ekg ordered today demonstrates NSR, borderline LAFB but does not meet axis deviation threshold, late R wave progression  Recent Labs: 09/05/2019: BUN 6; Creatinine, Ser 0.76; Hemoglobin 12.5; Platelets 459; Potassium 3.4; Sodium 132  Recent Lipid Panel    Component Value Date/Time   CHOL 197 10/27/2010 2150   TRIG 120 10/27/2010 2150   HDL 53 10/27/2010 2150   CHOLHDL 3.7 Ratio 10/27/2010 2150   VLDL 24 10/27/2010 2150   LDLCALC 120 (H) 10/27/2010 2150    Physical Exam:    VS:  BP 104/62   Pulse 69   Ht 5\' 9"  (1.753 m)   Wt 181 lb 6.4 oz (82.3 kg)   SpO2 99%   BMI 26.79 kg/m     Wt Readings from Last 3 Encounters:  09/07/19 181 lb 6.4 oz (82.3 kg)  09/05/19 178 lb 9 oz (81 kg)  01/31/18 176 lb (79.8 kg)    GEN: Well nourished, well developed in no acute distress HEENT: Normal, moist mucous membranes NECK: No JVD CARDIAC: regular rhythm, normal S1 and S2, no murmurs, rubs, gallops.  VASCULAR: Radial and DP pulses 2+ bilaterally. No carotid bruits RESPIRATORY:  Clear to auscultation without rales, wheezing or rhonchi  ABDOMEN: Soft, non-tender, non-distended MUSCULOSKELETAL:  Ambulates independently  SKIN: Warm and dry, no edema NEUROLOGIC:  Alert and oriented x 3. No focal neuro deficits noted. PSYCHIATRIC:  Normal affect    ASSESSMENT:    1. Pre-op evaluation   2. Abnormal ECG   3. Cardiac risk counseling   4. Counseling on health promotion and disease prevention   5. Tobacco abuse counseling    PLAN:    Preoperative cardiovascular evaluation: no cardiac history, no active symptoms. Risk factors of HTN, type II diabetes, Can achieve greater than 4 METs with routine daily activities without symptoms. According to ACC/AHA guidelines, no further cardiovascular testing needed.  The patient may proceed to surgery at acceptable risk.    Abnormal ECG: I cannot see ECG from PCP office, but today she has borderline abnormalities. No high risk findings on ECG. As she is asymptomatic, no further testing indicated  Tobacco use: The patient was counseled on tobacco cessation today for 4 minutes.  Counseling included reviewing the risks of smoking tobacco products, how  it impacts the patient's current medical diagnoses and different strategies for quitting.  Pharmacotherapy to aid in tobacco cessation was not prescribed today.  Cardiac risk counseling and prevention recommendations: -recommend heart healthy/Mediterranean diet, with whole grains, fruits, vegetable, fish, lean meats, nuts, and olive oil. Limit salt. -recommend moderate walking, 3-5 times/week for 30-50 minutes each session. Aim for at least 150 minutes.week. Goal should be pace of 3 miles/hours, or walking 1.5 miles in 30 minutes -recommend avoidance of tobacco products. Avoid excess alcohol.  Plan for follow up: as needed  Medication Adjustments/Labs and Tests Ordered: Current medicines are reviewed at length with the patient today.  Concerns regarding medicines are outlined above.  Orders Placed This Encounter  Procedures  . EKG 12-Lead   No orders of the defined types were placed in this encounter.   Patient  Instructions  Medication Instructions:  Your Physician recommend you continue on your current medication as directed.    If you need a refill on your cardiac medications before your next appointment, please call your pharmacy.   Lab work: None  Testing/Procedures: None  Follow-Up: At [facility], you and your health needs are our priority.  As part of our continuing mission to provide you with exceptional heart care, we have created designated Provider Care Teams.  These Care Teams include your primary Cardiologist (physician) and Advanced Practice Providers (APPs -  Physician Assistants and Nurse Practitioners) who all work together to provide you with the care you need, when you need it. You will need a follow up appointment as needed   Please call our office 2 months in advance to schedule this appointment.  You may see Dr. Louretta Parma or one of the following Advanced Practice Providers on your designated Care Team:   Rosaria Ferries, PA-C . Jory Sims, DNP, ANP       Signed, Buford Dresser, MD PhD 09/07/2019  [facility]

## 2019-09-09 ENCOUNTER — Encounter: Payer: Self-pay | Admitting: Cardiology

## 2019-09-11 ENCOUNTER — Other Ambulatory Visit (HOSPITAL_COMMUNITY)
Admission: RE | Admit: 2019-09-11 | Discharge: 2019-09-11 | Disposition: A | Discharge status: Home / Self Care | Payer: Medicare Other | Source: Ambulatory Visit | Attending: Orthopedic Surgery | Admitting: Orthopedic Surgery

## 2019-09-11 DIAGNOSIS — Z20828 Contact with and (suspected) exposure to other viral communicable diseases: Secondary | ICD-10-CM | POA: Insufficient documentation

## 2019-09-11 DIAGNOSIS — Z01812 Encounter for preprocedural laboratory examination: Secondary | ICD-10-CM | POA: Insufficient documentation

## 2019-09-12 LAB — NOVEL CORONAVIRUS, NAA (HOSP ORDER, SEND-OUT TO REF LAB; TAT 18-24 HRS): SARS-CoV-2, NAA: NOT DETECTED

## 2019-09-13 NOTE — Anesthesia Preprocedure Evaluation (Addendum)
Anesthesia Evaluation  Patient identified by MRN, date of birth, ID band  Airway Mallampati: I       Dental no notable dental hx. (+) Teeth Intact   Pulmonary Current Smoker and Patient abstained from smoking.,    Pulmonary exam normal breath sounds clear to auscultation       Cardiovascular hypertension, Pt. on medications Normal cardiovascular exam Rhythm:Regular Rate:Normal     Neuro/Psych negative psych ROS   GI/Hepatic   Endo/Other  Hypothyroidism   Renal/GU      Musculoskeletal  (+) Arthritis , Osteoarthritis,    Abdominal Normal abdominal exam  (+)   Peds  Hematology   Anesthesia Other Findings   Reproductive/Obstetrics                            Anesthesia Physical Anesthesia Plan  ASA: II  Anesthesia Plan: Spinal   Post-op Pain Management:  Regional for Post-op pain   Induction:   PONV Risk Score and Plan:   Airway Management Planned: Nasal Cannula and Simple Face Mask  Additional Equipment: None  Intra-op Plan:   Post-operative Plan:   Informed Consent:   Plan Discussed with:   Anesthesia Plan Comments: (See PAT note 09/05/2019, Konrad Felix, PA-C)       Anesthesia Quick Evaluation

## 2019-09-13 NOTE — Progress Notes (Signed)
Anesthesia Chart Review   Case: [id number] Date/Time: 09/14/19 0715   Procedure: TOTAL KNEE ARTHROPLASTY (Right Knee)   Anesthesia type: Spinal   Pre-op diagnosis: Right knee end stage osteoarthritis   Location: [facility] ROOM 06 / [facility]   Surgeon: Netta Cedars, MD      DISCUSSION:73 y.o. current some day smoker with h/o HTN, hypothyroidism, DM II on metformin, right knee OA scheduled for above procedure 09/14/2019 with Dr. Netta Cedars.   Pt seen by cardiologist, Dr. Buford Dresser, 09/07/2019 for preoperative evaluation.  Per OV note, "Preoperative cardiovascular evaluation: no cardiac history, no active symptoms. Risk factors of HTN, type II diabetes, Can achieve greater than 4 METs with routine daily activities without symptoms. According to ACC/AHA guidelines, no further cardiovascular testing needed.  The patient may proceed to surgery at acceptable risk.    Abnormal ECG: I cannot see ECG from PCP office, but today she has borderline abnormalities. No high risk findings on ECG. As she is asymptomatic, no further testing indicated"  Anticipate pt can proceed with planned procedure barring acute status change.   VS: BP 114/74 (BP Location: Left Arm)   Pulse 65   Temp 37.1 C (Oral)   Resp 18   Ht 5\' 9"  (1.753 m)   Wt 81 kg   SpO2 100%   BMI 26.37 kg/m   PROVIDERS: Donald Prose, MD is PCP   Andree Elk, MD is Cardiologist  LABS: Labs reviewed: Acceptable for surgery. (all labs ordered are listed, but only abnormal results are displayed)  Labs Reviewed  GLUCOSE, CAPILLARY - Abnormal; Notable for the following components:      Result Value   Glucose-Capillary 186 (*)    All other components within normal limits  CBC - Abnormal; Notable for the following components:   Platelets 459 (*)    All other components within normal limits  HEMOGLOBIN A1C - Abnormal; Notable for the following components:   Hgb A1c MFr Bld 7.3 (*)    All other components within normal  limits  BASIC METABOLIC PANEL - Abnormal; Notable for the following components:   Sodium 132 (*)    Potassium 3.4 (*)    Chloride 94 (*)    Glucose, Bld 131 (*)    BUN 6 (*)    All other components within normal limits  SURGICAL PCR SCREEN     IMAGES:   EKG: 08/22/2019 (on chart) 64 bpm Sinus rhythm  Left axis anterior fascicular block  Negative T-waves Possible anterior ishemia  CV:  Past Medical History:  Diagnosis Date  . Allergic rhinitis   . Colorectal polyps   . Hypertension   . Hypothyroidism     Past Surgical History:  Procedure Laterality Date  . allergic rhinitis    . BREAST BIOPSY Left 2012   benign  . COLONOSCOPY  10/13/2011   Procedure: COLONOSCOPY;  Surgeon: Missy Sabins, MD;  Location: [facility];  Service: Endoscopy;  Laterality: N/A;  . LUNG SURGERY  25 years ago   benign tumor    MEDICATIONS: . acetaminophen (TYLENOL) 650 MG CR tablet  . Ascorbic Acid (VITAMIN C GUMMIES PO)  . aspirin EC 81 MG tablet  . cholecalciferol (VITAMIN D) 25 MCG (1000 UT) tablet  . dorzolamide-timolol (COSOPT) 22.3-6.8 MG/ML ophthalmic solution  . fish oil-omega-3 fatty acids 1000 MG capsule  . hydrochlorothiazide (HYDRODIURIL) 25 MG tablet  . latanoprost (XALATAN) 0.005 % ophthalmic solution  . levothyroxine (SYNTHROID, LEVOTHROID) 88 MCG tablet  . loratadine (CLARITIN) 10 MG  tablet  . metFORMIN (GLUCOPHAGE-XR) 500 MG 24 hr tablet  . simvastatin (ZOCOR) 20 MG tablet   No current facility-administered medications for this encounter.    Maia Plan [facility] Pre-Surgical Testing [phone] 09/13/19  11:07 AM

## 2019-09-14 ENCOUNTER — Other Ambulatory Visit: Payer: Self-pay

## 2019-09-14 ENCOUNTER — Encounter (HOSPITAL_COMMUNITY)
Admission: AD | Disposition: A | Discharge status: Home / Self Care | Payer: Self-pay | Source: Home / Self Care | Attending: Orthopedic Surgery

## 2019-09-14 ENCOUNTER — Inpatient Hospital Stay (HOSPITAL_COMMUNITY)
Admission: AD | Admit: 2019-09-14 | Discharge: 2019-09-20 | DRG: 470 | Disposition: A | Discharge status: Home / Self Care | Payer: Medicare Other | Attending: Orthopedic Surgery | Admitting: Orthopedic Surgery

## 2019-09-14 ENCOUNTER — Ambulatory Visit (HOSPITAL_COMMUNITY): Payer: Medicare Other | Admitting: Certified Registered"

## 2019-09-14 ENCOUNTER — Ambulatory Visit (HOSPITAL_COMMUNITY): Payer: Medicare Other | Admitting: Physician Assistant

## 2019-09-14 ENCOUNTER — Encounter (HOSPITAL_COMMUNITY): Payer: Self-pay

## 2019-09-14 DIAGNOSIS — Z79890 Hormone replacement therapy (postmenopausal): Secondary | ICD-10-CM

## 2019-09-14 DIAGNOSIS — E1169 Type 2 diabetes mellitus with other specified complication: Secondary | ICD-10-CM | POA: Diagnosis present

## 2019-09-14 DIAGNOSIS — J9811 Atelectasis: Secondary | ICD-10-CM | POA: Diagnosis not present

## 2019-09-14 DIAGNOSIS — D509 Iron deficiency anemia, unspecified: Secondary | ICD-10-CM | POA: Diagnosis present

## 2019-09-14 DIAGNOSIS — Z79899 Other long term (current) drug therapy: Secondary | ICD-10-CM

## 2019-09-14 DIAGNOSIS — M1711 Unilateral primary osteoarthritis, right knee: Principal | ICD-10-CM | POA: Diagnosis present

## 2019-09-14 DIAGNOSIS — E876 Hypokalemia: Secondary | ICD-10-CM | POA: Diagnosis present

## 2019-09-14 DIAGNOSIS — Z7951 Long term (current) use of inhaled steroids: Secondary | ICD-10-CM

## 2019-09-14 DIAGNOSIS — E785 Hyperlipidemia, unspecified: Secondary | ICD-10-CM | POA: Diagnosis present

## 2019-09-14 DIAGNOSIS — Z20828 Contact with and (suspected) exposure to other viral communicable diseases: Secondary | ICD-10-CM | POA: Diagnosis present

## 2019-09-14 DIAGNOSIS — Z79891 Long term (current) use of opiate analgesic: Secondary | ICD-10-CM

## 2019-09-14 DIAGNOSIS — Z7982 Long term (current) use of aspirin: Secondary | ICD-10-CM

## 2019-09-14 DIAGNOSIS — E861 Hypovolemia: Secondary | ICD-10-CM | POA: Diagnosis not present

## 2019-09-14 DIAGNOSIS — E1165 Type 2 diabetes mellitus with hyperglycemia: Secondary | ICD-10-CM | POA: Diagnosis not present

## 2019-09-14 DIAGNOSIS — R0902 Hypoxemia: Secondary | ICD-10-CM

## 2019-09-14 DIAGNOSIS — E039 Hypothyroidism, unspecified: Secondary | ICD-10-CM | POA: Diagnosis present

## 2019-09-14 DIAGNOSIS — I10 Essential (primary) hypertension: Secondary | ICD-10-CM | POA: Diagnosis present

## 2019-09-14 DIAGNOSIS — F17210 Nicotine dependence, cigarettes, uncomplicated: Secondary | ICD-10-CM | POA: Diagnosis present

## 2019-09-14 DIAGNOSIS — D62 Acute posthemorrhagic anemia: Secondary | ICD-10-CM | POA: Diagnosis not present

## 2019-09-14 DIAGNOSIS — Z96651 Presence of right artificial knee joint: Secondary | ICD-10-CM

## 2019-09-14 DIAGNOSIS — D72829 Elevated white blood cell count, unspecified: Secondary | ICD-10-CM | POA: Diagnosis present

## 2019-09-14 DIAGNOSIS — Z7984 Long term (current) use of oral hypoglycemic drugs: Secondary | ICD-10-CM

## 2019-09-14 DIAGNOSIS — E871 Hypo-osmolality and hyponatremia: Secondary | ICD-10-CM | POA: Diagnosis present

## 2019-09-14 DIAGNOSIS — Z803 Family history of malignant neoplasm of breast: Secondary | ICD-10-CM

## 2019-09-14 HISTORY — PX: TOTAL KNEE ARTHROPLASTY: SHX125

## 2019-09-14 LAB — GLUCOSE, CAPILLARY
Glucose-Capillary: 125 mg/dL — ABNORMAL HIGH (ref 70–99)
Glucose-Capillary: 157 mg/dL — ABNORMAL HIGH (ref 70–99)
Glucose-Capillary: 158 mg/dL — ABNORMAL HIGH (ref 70–99)
Glucose-Capillary: 201 mg/dL — ABNORMAL HIGH (ref 70–99)
Glucose-Capillary: 128 mg/dL — ABNORMAL HIGH (ref 70–99)

## 2019-09-14 SURGERY — ARTHROPLASTY, KNEE, TOTAL
Anesthesia: Spinal | Site: Knee | Laterality: Right

## 2019-09-14 MED ORDER — SIMVASTATIN 20 MG PO TABS
20.0000 mg | ORAL_TABLET | Freq: Every day | ORAL | Status: DC
  Administered 2019-09-15 – 2019-09-20 (×6): 20 mg via ORAL
  Filled 2019-09-14 (×6): qty 1

## 2019-09-14 MED ORDER — ONDANSETRON HCL 4 MG/2ML IJ SOLN
INTRAMUSCULAR | Status: DC | PRN
  Administered 2019-09-14: 4 mg via INTRAVENOUS

## 2019-09-14 MED ORDER — METHOCARBAMOL 500 MG PO TABS
500.0000 mg | ORAL_TABLET | Freq: Four times a day (QID) | ORAL | Status: DC | PRN
  Administered 2019-09-14 – 2019-09-17 (×6): 500 mg via ORAL
  Filled 2019-09-14 (×6): qty 1

## 2019-09-14 MED ORDER — LACTATED RINGERS IV SOLN
INTRAVENOUS | Status: DC
  Administered 2019-09-14: 06:00:00 via INTRAVENOUS

## 2019-09-14 MED ORDER — OMEGA-3 FATTY ACIDS 1000 MG PO CAPS: 1.0000 g | ORAL_CAPSULE | Freq: Two times a day (BID) | ORAL | Status: DC

## 2019-09-14 MED ORDER — FERROUS SULFATE 325 (65 FE) MG PO TABS
325.0000 mg | ORAL_TABLET | Freq: Three times a day (TID) | ORAL | Status: DC
  Administered 2019-09-14 – 2019-09-20 (×19): 325 mg via ORAL
  Filled 2019-09-14 (×19): qty 1

## 2019-09-14 MED ORDER — CEFAZOLIN SODIUM-DEXTROSE 2-4 GM/100ML-% IV SOLN
2.0000 g | Freq: Four times a day (QID) | INTRAVENOUS | Status: AC
  Administered 2019-09-14 (×2): 2 g via INTRAVENOUS
  Filled 2019-09-14 (×2): qty 100

## 2019-09-14 MED ORDER — METHOCARBAMOL 500 MG IVPB - SIMPLE MED
INTRAVENOUS | Status: AC
  Filled 2019-09-14: qty 50

## 2019-09-14 MED ORDER — 0.9 % SODIUM CHLORIDE (POUR BTL) OPTIME
TOPICAL | Status: DC | PRN
  Administered 2019-09-14: 07:00:00 1000 mL

## 2019-09-14 MED ORDER — HYDROMORPHONE HCL 1 MG/ML IJ SOLN: 0.2500 mg | INTRAMUSCULAR | Status: DC | PRN

## 2019-09-14 MED ORDER — METOCLOPRAMIDE HCL 5 MG/ML IJ SOLN: 5.0000 mg | Freq: Three times a day (TID) | INTRAMUSCULAR | Status: DC | PRN

## 2019-09-14 MED ORDER — PROMETHAZINE HCL 25 MG/ML IJ SOLN: 6.2500 mg | INTRAMUSCULAR | Status: DC | PRN

## 2019-09-14 MED ORDER — INSULIN ASPART 100 UNIT/ML ~~LOC~~ SOLN: 0.0000 [IU] | Freq: Every day | SUBCUTANEOUS | Status: DC

## 2019-09-14 MED ORDER — EPHEDRINE SULFATE-NACL 50-0.9 MG/10ML-% IV SOSY
PREFILLED_SYRINGE | INTRAVENOUS | Status: DC | PRN
  Administered 2019-09-14 (×2): 10 mg via INTRAVENOUS
  Administered 2019-09-14: 5 mg via INTRAVENOUS
  Administered 2019-09-14: 10 mg via INTRAVENOUS
  Administered 2019-09-14: 5 mg via INTRAVENOUS

## 2019-09-14 MED ORDER — LEVOTHYROXINE SODIUM 88 MCG PO TABS
88.0000 ug | ORAL_TABLET | Freq: Every day | ORAL | Status: DC
  Administered 2019-09-15 – 2019-09-18 (×4): 88 ug via ORAL
  Filled 2019-09-14 (×4): qty 1

## 2019-09-14 MED ORDER — ACETAMINOPHEN ER 650 MG PO TBCR: 650.0000 mg | EXTENDED_RELEASE_TABLET | Freq: Three times a day (TID) | ORAL | Status: DC | PRN

## 2019-09-14 MED ORDER — ASPIRIN EC 81 MG PO TBEC
81.0000 mg | DELAYED_RELEASE_TABLET | Freq: Two times a day (BID) | ORAL | Status: DC
  Administered 2019-09-14 – 2019-09-20 (×12): 81 mg via ORAL
  Filled 2019-09-14 (×12): qty 1

## 2019-09-14 MED ORDER — TRANEXAMIC ACID-NACL 1000-0.7 MG/100ML-% IV SOLN
1000.0000 mg | INTRAVENOUS | Status: AC
  Administered 2019-09-14: 08:00:00 1000 mg via INTRAVENOUS
  Filled 2019-09-14: qty 100

## 2019-09-14 MED ORDER — METOCLOPRAMIDE HCL 5 MG PO TABS: 5.0000 mg | ORAL_TABLET | Freq: Three times a day (TID) | ORAL | Status: DC | PRN

## 2019-09-14 MED ORDER — ONDANSETRON HCL 4 MG/2ML IJ SOLN
INTRAMUSCULAR | Status: AC
  Filled 2019-09-14: qty 2

## 2019-09-14 MED ORDER — CLONIDINE HCL (ANALGESIA) 100 MCG/ML EP SOLN
EPIDURAL | Status: DC | PRN
  Administered 2019-09-14: 100 ug

## 2019-09-14 MED ORDER — OMEGA-3-ACID ETHYL ESTERS 1 G PO CAPS
1.0000 g | ORAL_CAPSULE | Freq: Two times a day (BID) | ORAL | Status: DC
  Administered 2019-09-15 – 2019-09-20 (×11): 1 g via ORAL
  Filled 2019-09-14 (×11): qty 1

## 2019-09-14 MED ORDER — ACETAMINOPHEN 325 MG PO TABS
325.0000 mg | ORAL_TABLET | Freq: Four times a day (QID) | ORAL | Status: DC | PRN
  Administered 2019-09-15 – 2019-09-16 (×2): 650 mg via ORAL
  Filled 2019-09-14 (×2): qty 2

## 2019-09-14 MED ORDER — MIDAZOLAM HCL 2 MG/2ML IJ SOLN
INTRAMUSCULAR | Status: AC
  Filled 2019-09-14: qty 2

## 2019-09-14 MED ORDER — PROPOFOL 10 MG/ML IV BOLUS
INTRAVENOUS | Status: AC
  Filled 2019-09-14: qty 40

## 2019-09-14 MED ORDER — BISACODYL 10 MG RE SUPP: 10.0000 mg | Freq: Every day | RECTAL | Status: DC | PRN

## 2019-09-14 MED ORDER — ONDANSETRON HCL 4 MG PO TABS
4.0000 mg | ORAL_TABLET | Freq: Four times a day (QID) | ORAL | Status: DC | PRN
  Administered 2019-09-17: 13:00:00 4 mg via ORAL
  Filled 2019-09-14: qty 1

## 2019-09-14 MED ORDER — VITAMIN D 25 MCG (1000 UNIT) PO TABS
1000.0000 [IU] | ORAL_TABLET | Freq: Every day | ORAL | Status: DC
  Administered 2019-09-15 – 2019-09-20 (×6): 1000 [IU] via ORAL
  Filled 2019-09-14 (×6): qty 1

## 2019-09-14 MED ORDER — TRANEXAMIC ACID-NACL 1000-0.7 MG/100ML-% IV SOLN
1000.0000 mg | Freq: Once | INTRAVENOUS | Status: AC
  Administered 2019-09-14: 12:00:00 1000 mg via INTRAVENOUS
  Filled 2019-09-14: qty 100

## 2019-09-14 MED ORDER — PROPOFOL 500 MG/50ML IV EMUL
INTRAVENOUS | Status: DC | PRN
  Administered 2019-09-14: 75 ug/kg/min via INTRAVENOUS

## 2019-09-14 MED ORDER — SODIUM CHLORIDE 0.9 % IR SOLN
Status: DC | PRN
  Administered 2019-09-14: 3000 mL

## 2019-09-14 MED ORDER — CEFAZOLIN SODIUM-DEXTROSE 2-4 GM/100ML-% IV SOLN
2.0000 g | INTRAVENOUS | Status: AC
  Administered 2019-09-14: 2 g via INTRAVENOUS
  Filled 2019-09-14: qty 100

## 2019-09-14 MED ORDER — PROPOFOL 10 MG/ML IV BOLUS
INTRAVENOUS | Status: DC | PRN
  Administered 2019-09-14: 20 mg via INTRAVENOUS

## 2019-09-14 MED ORDER — DEXAMETHASONE SODIUM PHOSPHATE 10 MG/ML IJ SOLN
INTRAMUSCULAR | Status: AC
  Filled 2019-09-14: qty 1

## 2019-09-14 MED ORDER — METHOCARBAMOL 500 MG IVPB - SIMPLE MED
500.0000 mg | Freq: Four times a day (QID) | INTRAVENOUS | Status: DC | PRN
  Administered 2019-09-14: 10:00:00 500 mg via INTRAVENOUS
  Filled 2019-09-14: qty 50

## 2019-09-14 MED ORDER — METHOCARBAMOL 500 MG PO TABS: 500.0000 mg | ORAL_TABLET | Freq: Three times a day (TID) | ORAL | 1 refills | Status: DC | PRN

## 2019-09-14 MED ORDER — SODIUM CHLORIDE 0.9 % IV SOLN
INTRAVENOUS | Status: DC
  Administered 2019-09-14: 11:00:00 via INTRAVENOUS

## 2019-09-14 MED ORDER — PHENOL 1.4 % MT LIQD: 1.0000 | OROMUCOSAL | Status: DC | PRN

## 2019-09-14 MED ORDER — DOCUSATE SODIUM 100 MG PO CAPS
100.0000 mg | ORAL_CAPSULE | Freq: Two times a day (BID) | ORAL | Status: DC
  Administered 2019-09-14 – 2019-09-20 (×12): 100 mg via ORAL
  Filled 2019-09-14 (×13): qty 1

## 2019-09-14 MED ORDER — POLYETHYLENE GLYCOL 3350 17 G PO PACK: 17.0000 g | PACK | Freq: Every day | ORAL | Status: DC | PRN

## 2019-09-14 MED ORDER — OXYCODONE-ACETAMINOPHEN 5-325 MG PO TABS: 1.0000 | ORAL_TABLET | ORAL | 0 refills | Status: AC | PRN

## 2019-09-14 MED ORDER — INSULIN ASPART 100 UNIT/ML ~~LOC~~ SOLN
0.0000 [IU] | Freq: Three times a day (TID) | SUBCUTANEOUS | Status: DC
  Administered 2019-09-14: 3 [IU] via SUBCUTANEOUS
  Administered 2019-09-14: 5 [IU] via SUBCUTANEOUS
  Administered 2019-09-15: 3 [IU] via SUBCUTANEOUS
  Administered 2019-09-15: 09:00:00 2 [IU] via SUBCUTANEOUS
  Administered 2019-09-15 – 2019-09-16 (×2): 3 [IU] via SUBCUTANEOUS
  Administered 2019-09-16 – 2019-09-18 (×6): 2 [IU] via SUBCUTANEOUS
  Administered 2019-09-18: 13:00:00 3 [IU] via SUBCUTANEOUS
  Administered 2019-09-19 – 2019-09-20 (×2): 2 [IU] via SUBCUTANEOUS
  Administered 2019-09-20: 12:00:00 3 [IU] via SUBCUTANEOUS

## 2019-09-14 MED ORDER — MENTHOL 3 MG MT LOZG: 1.0000 | LOZENGE | OROMUCOSAL | Status: DC | PRN

## 2019-09-14 MED ORDER — ASPIRIN EC 81 MG PO TBEC: 81.0000 mg | DELAYED_RELEASE_TABLET | Freq: Two times a day (BID) | ORAL | 0 refills | Status: AC

## 2019-09-14 MED ORDER — ONDANSETRON HCL 4 MG/2ML IJ SOLN
4.0000 mg | Freq: Four times a day (QID) | INTRAMUSCULAR | Status: DC | PRN
  Filled 2019-09-14: qty 2

## 2019-09-14 MED ORDER — POVIDONE-IODINE 10 % EX SWAB: 2.0000 "application " | Freq: Once | CUTANEOUS | Status: DC

## 2019-09-14 MED ORDER — DEXAMETHASONE SODIUM PHOSPHATE 10 MG/ML IJ SOLN
INTRAMUSCULAR | Status: DC | PRN
  Administered 2019-09-14: 8 mg via INTRAVENOUS

## 2019-09-14 MED ORDER — LATANOPROST 0.005 % OP SOLN
1.0000 [drp] | Freq: Every day | OPHTHALMIC | Status: DC
  Administered 2019-09-14 – 2019-09-19 (×6): 1 [drp] via OPHTHALMIC
  Filled 2019-09-14: qty 2.5

## 2019-09-14 MED ORDER — WATER FOR IRRIGATION, STERILE IR SOLN
Status: DC | PRN
  Administered 2019-09-14 (×2): 1000 mL

## 2019-09-14 MED ORDER — KETOROLAC TROMETHAMINE 15 MG/ML IJ SOLN: 15.0000 mg | Freq: Once | INTRAMUSCULAR | Status: DC

## 2019-09-14 MED ORDER — PROPOFOL 500 MG/50ML IV EMUL
INTRAVENOUS | Status: AC
  Filled 2019-09-14: qty 50

## 2019-09-14 MED ORDER — FENTANYL CITRATE (PF) 100 MCG/2ML IJ SOLN
INTRAMUSCULAR | Status: DC | PRN
  Administered 2019-09-14 (×2): 50 ug via INTRAVENOUS

## 2019-09-14 MED ORDER — BUPIVACAINE IN DEXTROSE 0.75-8.25 % IT SOLN
INTRATHECAL | Status: DC | PRN
  Administered 2019-09-14: 1.6 mL via INTRATHECAL

## 2019-09-14 MED ORDER — FENTANYL CITRATE (PF) 100 MCG/2ML IJ SOLN
INTRAMUSCULAR | Status: AC
  Filled 2019-09-14: qty 2

## 2019-09-14 MED ORDER — HYDROMORPHONE HCL 1 MG/ML IJ SOLN
0.5000 mg | INTRAMUSCULAR | Status: DC | PRN
  Administered 2019-09-15 – 2019-09-16 (×2): 0.5 mg via INTRAVENOUS
  Filled 2019-09-14 (×2): qty 0.5

## 2019-09-14 MED ORDER — INSULIN ASPART 100 UNIT/ML ~~LOC~~ SOLN
4.0000 [IU] | Freq: Three times a day (TID) | SUBCUTANEOUS | Status: DC
  Administered 2019-09-14 – 2019-09-20 (×16): 4 [IU] via SUBCUTANEOUS

## 2019-09-14 MED ORDER — MIDAZOLAM HCL 2 MG/2ML IJ SOLN
INTRAMUSCULAR | Status: DC | PRN
  Administered 2019-09-14 (×2): 1 mg via INTRAVENOUS

## 2019-09-14 MED ORDER — OXYCODONE HCL 5 MG PO TABS
5.0000 mg | ORAL_TABLET | ORAL | Status: DC | PRN
  Administered 2019-09-14: 22:00:00 10 mg via ORAL
  Administered 2019-09-14 (×2): 5 mg via ORAL
  Administered 2019-09-15 – 2019-09-18 (×9): 10 mg via ORAL
  Administered 2019-09-19 – 2019-09-20 (×3): 5 mg via ORAL
  Filled 2019-09-14 (×3): qty 1
  Filled 2019-09-14: qty 2
  Filled 2019-09-14: qty 1
  Filled 2019-09-14 (×7): qty 2
  Filled 2019-09-14: qty 1
  Filled 2019-09-14: qty 2
  Filled 2019-09-14: qty 1
  Filled 2019-09-14: qty 2

## 2019-09-14 MED ORDER — LORATADINE 10 MG PO TABS
10.0000 mg | ORAL_TABLET | Freq: Every day | ORAL | Status: DC
  Administered 2019-09-15 – 2019-09-20 (×6): 10 mg via ORAL
  Filled 2019-09-14 (×6): qty 1

## 2019-09-14 MED ORDER — MEPERIDINE HCL 50 MG/ML IJ SOLN: 6.2500 mg | INTRAMUSCULAR | Status: DC | PRN

## 2019-09-14 MED ORDER — ROPIVACAINE HCL 5 MG/ML IJ SOLN
INTRAMUSCULAR | Status: DC | PRN
  Administered 2019-09-14 (×2): 5 mL via PERINEURAL

## 2019-09-14 MED ORDER — ROPIVACAINE HCL 7.5 MG/ML IJ SOLN
INTRAMUSCULAR | Status: DC | PRN
  Administered 2019-09-14 (×4): 5 mL via PERINEURAL

## 2019-09-14 MED ORDER — PHENYLEPHRINE HCL (PRESSORS) 10 MG/ML IV SOLN
INTRAVENOUS | Status: AC
  Filled 2019-09-14: qty 1

## 2019-09-14 MED ORDER — DORZOLAMIDE HCL-TIMOLOL MAL 2-0.5 % OP SOLN
1.0000 [drp] | Freq: Two times a day (BID) | OPHTHALMIC | Status: DC
  Administered 2019-09-14 – 2019-09-20 (×12): 1 [drp] via OPHTHALMIC
  Filled 2019-09-14: qty 10

## 2019-09-14 MED ORDER — HYDROCHLOROTHIAZIDE 25 MG PO TABS
25.0000 mg | ORAL_TABLET | Freq: Every day | ORAL | Status: DC
  Administered 2019-09-15 – 2019-09-17 (×3): 25 mg via ORAL
  Filled 2019-09-14 (×3): qty 1

## 2019-09-14 MED ORDER — LIDOCAINE 2% (20 MG/ML) 5 ML SYRINGE
INTRAMUSCULAR | Status: AC
  Filled 2019-09-14: qty 5

## 2019-09-14 MED ORDER — METFORMIN HCL ER 500 MG PO TB24
500.0000 mg | ORAL_TABLET | Freq: Three times a day (TID) | ORAL | Status: DC
  Administered 2019-09-14 – 2019-09-17 (×10): 500 mg via ORAL
  Filled 2019-09-14 (×10): qty 1

## 2019-09-14 SURGICAL SUPPLY — 54 items
BAG SPEC THK2 15X12 ZIP CLS (MISCELLANEOUS)
BAG ZIPLOCK 12X15 (MISCELLANEOUS) IMPLANT
BLADE SAG 18X100X1.27 (BLADE) ×3 IMPLANT
BLADE SAW SGTL 13X75X1.27 (BLADE) ×3 IMPLANT
BNDG CMPR MED 10X6 ELC LF (GAUZE/BANDAGES/DRESSINGS) ×1
BNDG ELASTIC 6X10 VLCR STRL LF (GAUZE/BANDAGES/DRESSINGS) ×3 IMPLANT
BNDG GAUZE ELAST 4 BULKY (GAUZE/BANDAGES/DRESSINGS) ×3 IMPLANT
BOWL SMART MIX CTS (DISPOSABLE) ×3 IMPLANT
CEMENT HV SMART SET (Cement) ×6 IMPLANT
CEMENT TIBIA MBT SIZE 4 (Knees) IMPLANT
CLOSURE WOUND 1/2 X4 (GAUZE/BANDAGES/DRESSINGS) ×2
COVER SURGICAL LIGHT HANDLE (MISCELLANEOUS) ×3 IMPLANT
COVER WAND RF STERILE (DRAPES) IMPLANT
CUFF TOURN SGL QUICK 34 (TOURNIQUET CUFF) ×3
CUFF TRNQT CYL 34X4.125X (TOURNIQUET CUFF) ×1 IMPLANT
DRAPE SHEET LG 3/4 BI-LAMINATE (DRAPES) ×3 IMPLANT
DRAPE U-SHAPE 47X51 STRL (DRAPES) ×3 IMPLANT
DRSG ADAPTIC 3X8 NADH LF (GAUZE/BANDAGES/DRESSINGS) ×3 IMPLANT
DRSG PAD ABDOMINAL 8X10 ST (GAUZE/BANDAGES/DRESSINGS) ×3 IMPLANT
DURAPREP 26ML APPLICATOR (WOUND CARE) ×3 IMPLANT
ELECT REM PT RETURN 15FT ADLT (MISCELLANEOUS) ×3 IMPLANT
FEMUR SIGMA PS SZ 3.0 R (Femur) ×2 IMPLANT
GAUZE SPONGE 4X4 12PLY STRL (GAUZE/BANDAGES/DRESSINGS) ×3 IMPLANT
GLOVE BIOGEL PI ORTHO PRO 7.5 (GLOVE) ×2
GLOVE BIOGEL PI ORTHO PRO SZ8 (GLOVE) ×2
GLOVE ORTHO TXT STRL SZ7.5 (GLOVE) ×3 IMPLANT
GLOVE PI ORTHO PRO STRL 7.5 (GLOVE) ×1 IMPLANT
GLOVE PI ORTHO PRO STRL SZ8 (GLOVE) ×1 IMPLANT
GLOVE SURG ORTHO 8.5 STRL (GLOVE) ×6 IMPLANT
GOWN STRL REUS W/TWL XL LVL3 (GOWN DISPOSABLE) ×6 IMPLANT
HANDPIECE INTERPULSE COAX TIP (DISPOSABLE) ×3
HOLDER FOLEY CATH W/STRAP (MISCELLANEOUS) IMPLANT
IMMOBILIZER KNEE 20 (SOFTGOODS) ×3
IMMOBILIZER KNEE 20 THIGH 36 (SOFTGOODS) IMPLANT
KIT TURNOVER KIT A (KITS) IMPLANT
MANIFOLD NEPTUNE II (INSTRUMENTS) ×3 IMPLANT
NS IRRIG 1000ML POUR BTL (IV SOLUTION) ×3 IMPLANT
PACK TOTAL KNEE CUSTOM (KITS) ×3 IMPLANT
PATELLA DOME PFC 38MM (Knees) ×2 IMPLANT
PIN STEINMAN FIXATION KNEE (PIN) ×2 IMPLANT
PLATE ROT INSERT 12.5MM SIZE 3 (Plate) ×2 IMPLANT
PROTECTOR NERVE ULNAR (MISCELLANEOUS) ×3 IMPLANT
SET HNDPC FAN SPRY TIP SCT (DISPOSABLE) ×1 IMPLANT
STAPLER VISISTAT 35W (STAPLE) IMPLANT
STRIP CLOSURE SKIN 1/2X4 (GAUZE/BANDAGES/DRESSINGS) ×4 IMPLANT
SUT MNCRL AB 3-0 PS2 18 (SUTURE) ×3 IMPLANT
SUT VIC AB 0 CT1 36 (SUTURE) ×3 IMPLANT
SUT VIC AB 1 CT1 36 (SUTURE) ×9 IMPLANT
SUT VIC AB 2-0 CT1 27 (SUTURE) ×3
SUT VIC AB 2-0 CT1 TAPERPNT 27 (SUTURE) ×1 IMPLANT
TIBIA MBT CEMENT SIZE 4 (Knees) ×3 IMPLANT
TRAY FOLEY MTR SLVR 16FR STAT (SET/KITS/TRAYS/PACK) ×3 IMPLANT
WATER STERILE IRR 1000ML POUR (IV SOLUTION) ×6 IMPLANT
YANKAUER SUCT BULB TIP 10FT TU (MISCELLANEOUS) ×3 IMPLANT

## 2019-09-14 NOTE — Care Management CC44 (Signed)
Condition Code 44 Documentation Completed  Patient Details  Name: Kelli Sandoval MRN: [id number] Date of Birth: [birth year]   Condition Code 44 given:  Yes Patient signature on Condition Code 44 notice:  Yes Documentation of 2 MD's agreement:  Yes Code 44 added to claim:  Yes    Leeroy Cha, RN 09/14/2019, 3:49 PM

## 2019-09-14 NOTE — Anesthesia Postprocedure Evaluation (Signed)
Anesthesia Post Note  Patient: Kelli Sandoval  Procedure(s) Performed: TOTAL KNEE ARTHROPLASTY (Right Knee)     Patient location during evaluation: Nursing Unit Anesthesia Type: Spinal Level of consciousness: awake Pain management: pain level controlled Vital Signs Assessment: post-procedure vital signs reviewed and stable Respiratory status: spontaneous breathing Cardiovascular status: stable Postop Assessment: no headache, no backache, spinal receding, patient able to bend at knees and no apparent nausea or vomiting Anesthetic complications: no    Last Vitals:  Vitals:   09/14/19 1045 09/14/19 1112  BP: (!) 127/59 117/69  Pulse: (!) 58 (!) 58  Resp: 12 17  Temp:  (!) 36.3 C  SpO2: 100% 100%    Last Pain:  Vitals:   09/14/19 1112  TempSrc: Oral  PainSc: 0-No pain   Pain Goal:    LLE Motor Response: Purposeful movement (09/14/19 1115) LLE Sensation: Decreased, Tingling (09/14/19 1115) RLE Motor Response: Purposeful movement (09/14/19 1115) RLE Sensation: Decreased, Tingling (09/14/19 1115) L Sensory Level: S1-Sole of foot, small toes (09/14/19 1115) R Sensory Level: S1-Sole of foot, small toes (09/14/19 1115)    Huston Foley
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
## 2019-09-14 NOTE — Interval H&P Note (Signed)
History and Physical Interval Note:  09/14/2019 7:28 AM  Kelli Sandoval  has presented today for surgery, with the diagnosis of Right knee end stage osteoarthritis.  The various methods of treatment have been discussed with the patient and family. After consideration of risks, benefits and other options for treatment, the patient has consented to  Procedure(s): TOTAL KNEE ARTHROPLASTY (Right) as a surgical intervention.  The patient's history has been reviewed, patient examined, no change in status, stable for surgery.  I have reviewed the patient's chart and labs.  Questions were answered to the patient's satisfaction.     Augustin Schooling

## 2019-09-14 NOTE — Brief Op Note (Signed)
09/14/2019  9:19 AM  PATIENT:  Kelli Sandoval  73 y.o. female  PRE-OPERATIVE DIAGNOSIS:  Right knee end stage osteoarthritis  POST-OPERATIVE DIAGNOSIS:  Right knee end stage osteoarthritis  PROCEDURE:  Procedure(s): TOTAL KNEE ARTHROPLASTY (Right) DePuy Sigma RP  SURGEON:  Surgeon(s) and Role:    Netta Cedars, MD - Primary  PHYSICIAN ASSISTANT:   ASSISTANTS: Ventura Bruns, PA-C   ANESTHESIA:   regional and spinal  EBL:  50 mL   BLOOD ADMINISTERED:none  DRAINS: none   LOCAL MEDICATIONS USED:  NONE  SPECIMEN:  No Specimen  DISPOSITION OF SPECIMEN:  N/A  COUNTS:  YES  TOURNIQUET:  * Missing tourniquet times found for documented tourniquets in log: [id number] *  DICTATION: .Other Dictation: Dictation Number [id number]  PLAN OF CARE: Admit to inpatient   PATIENT DISPOSITION:  PACU - hemodynamically stable.   Delay start of Pharmacological VTE agent (>24hrs) due to surgical blood loss or risk of bleeding: no

## 2019-09-14 NOTE — Care Management Obs Status (Signed)
[facility] NOTIFICATION   Patient Details  Name: CHRITINA OSIER MRN: [id number] Date of Birth: 1946-02-15   Medicare Observation Status Notification Given:  Yes    Leeroy Cha, RN 09/14/2019, 3:48 PM

## 2019-09-14 NOTE — Transfer of Care (Signed)
Immediate Anesthesia Transfer of Care Note  Patient: Kelli Sandoval  Procedure(s) Performed: TOTAL KNEE ARTHROPLASTY (Right Knee)  Patient Location: PACU  Anesthesia Type:Spinal  Level of Consciousness: awake, alert  and oriented  Airway & Oxygen Therapy: Patient Spontanous Breathing and Patient connected to face mask oxygen  Post-op Assessment: Report given to RN and Post -op Vital signs reviewed and stable  Post vital signs: Reviewed and stable  Last Vitals:  Vitals Value Taken Time  BP 118/62 09/14/19 0930  Temp    Pulse 59 09/14/19 0932  Resp 15 09/14/19 0932  SpO2 100 % 09/14/19 0932  Vitals shown include unvalidated device data.  Last Pain:  Vitals:   09/14/19 0541  TempSrc: Oral         Complications: No apparent anesthesia complications

## 2019-09-14 NOTE — Anesthesia Procedure Notes (Signed)
Procedure Name: MAC Date/Time: 09/14/2019 7:35 AM Performed by: Eben Burow, CRNA Pre-anesthesia Checklist: Patient identified, Emergency Drugs available, Suction available, Patient being monitored and Timeout performed Oxygen Delivery Method: Simple face mask Dental Injury: Teeth and Oropharynx as per pre-operative assessment

## 2019-09-14 NOTE — TOC Progression Note (Signed)
Transition of Care [facility]) - Progression Note    Patient Details  Name: Kelli Sandoval MRN: [id number] Date of Birth: Sep 24, 1946  Transition of [facility] CM/SW Contact  Leeroy Cha, RN Phone Number: 09/14/2019, 3:01 PM  Clinical Narrative:    Home hhc ordered Will be done through [facility]   Expected Discharge Plan: [place]    Expected Discharge Plan and Services Expected Discharge Plan: [place]: PT [place]: [facility] Date [place]: 09/14/19 Time [place]: 1501 Representative spoke with at [place]: mike   Social Determinants of Health ([place]) Interventions    Readmission Risk Interventions No flowsheet data found.

## 2019-09-14 NOTE — Anesthesia Procedure Notes (Signed)
Anesthesia Regional Block: Adductor canal block   Pre-Anesthetic Checklist: ,, timeout performed, Correct Patient, Correct Site, Correct Laterality, Correct Procedure, Correct Position, site marked, Risks and benefits discussed,  Surgical consent,  Pre-op evaluation,  At surgeon's request and post-op pain management  Laterality: Lower and Right  Prep: chloraprep       Needles:  Injection technique: Single-shot  Needle Type: Echogenic Stimulator Needle     Needle Length: 10cm  Needle Gauge: 21   Needle insertion depth: 3 cm   Additional Needles:   Procedures:,,,, ultrasound used (permanent image in chart),,,,  Narrative:  Start time: 09/14/2019 7:06 AM End time: 09/14/2019 7:16 AM Injection made incrementally with aspirations every 5 mL.  Performed by: Personally  Anesthesiologist: Lyn Hollingshead, MD

## 2019-09-14 NOTE — Discharge Instructions (Signed)
Ice to the knee constantly.  Keep the incision covered and clean and dry for one week, then ok to get it wet in the shower.  Do exercise as instructed several times per day. Ok to put full body weight on the right leg.  DO NOT prop anything behind the knee, it will make your knee stiff.  Elevate the leg by propping under the calf and ankle to maintain knee extension.  Please wear the knee immobilizer at night to maintain knee extension.   Use the walker for balance while you are up and around  Follow up with Dr Veverly Fells in two weeks in the office, call [phone] for appt

## 2019-09-14 NOTE — Anesthesia Procedure Notes (Signed)
Spinal  Patient location during procedure: OR Start time: 09/14/2019 7:38 AM End time: 09/14/2019 7:41 AM Staffing Anesthesiologist: Lyn Hollingshead, MD Performed: anesthesiologist  Preanesthetic Checklist Completed: patient identified, site marked, surgical consent, pre-op evaluation, timeout performed, IV checked, risks and benefits discussed and monitors and equipment checked Spinal Block Patient position: sitting Prep: site prepped and draped and DuraPrep Patient monitoring: continuous pulse ox and blood pressure Approach: midline Location: L3-4 Injection technique: single-shot Needle Needle type: Pencan  Needle gauge: 24 G Needle length: 10 cm Needle insertion depth: 5 cm Assessment Sensory level: T8

## 2019-09-14 NOTE — Op Note (Signed)
NAME: LAKEILA, GRUNDEN MEDICAL RECORD [id number] ACCOUNT [id number] DATE OF BIRTH:15-Jan-1946 FACILITY: [facility] LOCATION: [facility] PHYSICIAN:STEVEN Orlena Sheldon, MD  OPERATIVE REPORT  DATE OF PROCEDURE:  09/14/2019  PREOPERATIVE DIAGNOSIS:  Right knee end-stage osteoarthritis.  POSTOPERATIVE DIAGNOSIS:  Right knee end-stage osteoarthritis.  PROCEDURE PERFORMED:  Right total knee replacement using DePuy Sigma rotating platform prosthesis.  ATTENDING SURGEON:  Esmond Plants, MD  ASSISTANT:  Darol Destine, [place], who was scrubbed during the entire procedure and necessary for satisfactory completion of surgery.  ANESTHESIA:  Spinal anesthesia was used plus a femoral block.  ESTIMATED BLOOD LOSS:  Minimal.  FLUID REPLACEMENT:  1500 mL crystalloid.  INSTRUMENT COUNTS:  Correct.  COMPLICATIONS:  No complications.  ANTIBIOTICS:  Perioperative antibiotics were given.  INDICATIONS:  The patient is a 73 year old female with worsening right knee pain secondary to end-stage arthritis.  The patient has bone-on-bone findings on standing radiographs.  The patient has had progressive pain despite conservative management  including injections, modification of activity, and pain medications and presents now for total knee arthroplasty to restore function and eliminate pain to her knee.  Informed consent obtained.  DESCRIPTION OF PROCEDURE:  After an adequate level of anesthesia was achieved, the patient was positioned in the supine position, right leg correctly identified.  A nonsterile tourniquet was placed on the proximal thigh.  Right leg was sterilely prepped  and draped in the usual manner.  Timeout called, verifying correct patient, correct site.  We elevated the limb and exsanguinated using Esmarch bandage, and then with the knee in flexion performed a longitudinal midline incision with a 10-blade scalpel.   Dissection down through subcutaneous tissues using the scalpel.  We used a  fresh 10 blade for the median parapatellar arthrotomy and divided the lateral patellofemoral ligaments, everted the patella, and flexed the knee.  We entered the distal femur  with a step-cut drill.  There was bone-on-bone arthritis noted with eburnation and a complete cartilage loss.  Upon entry to the distal femur, we placed our intramedullary resection guide, resecting 10 mm of bone off the distal right femur set on 5  degrees right.  We then went ahead and measured our anterior posterior dimension, which was a size 3.  We performed our anterior, posterior, and chamfer cuts with a 4-in-1 block.  We then removed ACL, PCL, and meniscal tissue, subluxed the tibia  anteriorly, and then used an external alignment guide for the tibial cut, which we performed 90 degrees perpendicular to the long axis of tibia with minimal posterior slope for this posterior cruciate substituting prosthesis.  Once we had our tibial cut  performed, we checked our gaps, which were symmetric at 10 mm, removed excess osteophytes off the posterior femur with a lamina spreader and an osteotome.  We then completed our tibial preparation with the modular drill and keel punch.  Next, we  completed our femoral preparation with the box cut guide, cutting for the posterior cruciate, substituting a femoral prosthesis.  We then impacted the size 3 right femur in place and used initially a 10 and a 12.5 poly trial, which provided excellent gap  stability, both in flexion and extension.  We then went ahead and completed our patellar preparation.  We measured a 25 mm thickness patella and then resected down to a size 15 and then cut for the 38 patellar button.  Once we had that cut done with the  patellar cutting guide, we drilled our lug holes for the 38 patellar button and  then placed our 38 button in place.  We ranged the knee and had a nice patellar tracking with no-touch technique.  We removed all trial components, pulse irrigated the knee,   and dried well.  We then cemented the components into place with DePuy high-viscosity cement, vacuum mixed on the back table.  Then once we cemented all the components into place, we placed the knee in extension with a 12.5 trial and held that pressure  until the cement was hardened.  We removed excess cement with a 1/4-inch curved osteotome.  We were pleased with the 12.5 poly and selected the real 12.5 poly, irrigated thoroughly, and placed that in place.  We had a nice little pop as that medial  condyle reduced with appropriate tension.  It was stable in flexion and extension, and we were able to get full extension, nice patellar tracking.  We then irrigated thoroughly and closed the parapatellar arthrotomy with #1 Vicryl suture, followed by 0  and 2-0 Vicryl layered subcutaneous closure, and 4-0 Monocryl for skin.  Steri-Strips applied followed by a sterile dressing.  The patient tolerated surgery well.  LN/NUANCE  D:09/14/2019 T:09/14/2019 JOB:[id number]

## 2019-09-15 DIAGNOSIS — R609 Edema, unspecified: Secondary | ICD-10-CM | POA: Diagnosis not present

## 2019-09-15 DIAGNOSIS — J9811 Atelectasis: Secondary | ICD-10-CM | POA: Diagnosis not present

## 2019-09-15 DIAGNOSIS — Z7984 Long term (current) use of oral hypoglycemic drugs: Secondary | ICD-10-CM | POA: Diagnosis not present

## 2019-09-15 DIAGNOSIS — Z79891 Long term (current) use of opiate analgesic: Secondary | ICD-10-CM | POA: Diagnosis not present

## 2019-09-15 DIAGNOSIS — Z79890 Hormone replacement therapy (postmenopausal): Secondary | ICD-10-CM | POA: Diagnosis not present

## 2019-09-15 DIAGNOSIS — Z803 Family history of malignant neoplasm of breast: Secondary | ICD-10-CM | POA: Diagnosis not present

## 2019-09-15 DIAGNOSIS — Z20828 Contact with and (suspected) exposure to other viral communicable diseases: Secondary | ICD-10-CM | POA: Diagnosis present

## 2019-09-15 DIAGNOSIS — I10 Essential (primary) hypertension: Secondary | ICD-10-CM | POA: Diagnosis present

## 2019-09-15 DIAGNOSIS — E1165 Type 2 diabetes mellitus with hyperglycemia: Secondary | ICD-10-CM | POA: Diagnosis not present

## 2019-09-15 DIAGNOSIS — E039 Hypothyroidism, unspecified: Secondary | ICD-10-CM | POA: Diagnosis present

## 2019-09-15 DIAGNOSIS — E1169 Type 2 diabetes mellitus with other specified complication: Secondary | ICD-10-CM | POA: Diagnosis present

## 2019-09-15 DIAGNOSIS — D72829 Elevated white blood cell count, unspecified: Secondary | ICD-10-CM | POA: Diagnosis not present

## 2019-09-15 DIAGNOSIS — Z7951 Long term (current) use of inhaled steroids: Secondary | ICD-10-CM | POA: Diagnosis not present

## 2019-09-15 DIAGNOSIS — Z96659 Presence of unspecified artificial knee joint: Secondary | ICD-10-CM | POA: Insufficient documentation

## 2019-09-15 DIAGNOSIS — F17210 Nicotine dependence, cigarettes, uncomplicated: Secondary | ICD-10-CM | POA: Diagnosis present

## 2019-09-15 DIAGNOSIS — E871 Hypo-osmolality and hyponatremia: Secondary | ICD-10-CM | POA: Diagnosis not present

## 2019-09-15 DIAGNOSIS — D62 Acute posthemorrhagic anemia: Secondary | ICD-10-CM | POA: Diagnosis not present

## 2019-09-15 DIAGNOSIS — Z79899 Other long term (current) drug therapy: Secondary | ICD-10-CM | POA: Diagnosis not present

## 2019-09-15 DIAGNOSIS — E785 Hyperlipidemia, unspecified: Secondary | ICD-10-CM | POA: Diagnosis present

## 2019-09-15 DIAGNOSIS — M1711 Unilateral primary osteoarthritis, right knee: Secondary | ICD-10-CM | POA: Diagnosis present

## 2019-09-15 DIAGNOSIS — E876 Hypokalemia: Secondary | ICD-10-CM | POA: Diagnosis not present

## 2019-09-15 DIAGNOSIS — Z7982 Long term (current) use of aspirin: Secondary | ICD-10-CM | POA: Diagnosis not present

## 2019-09-15 DIAGNOSIS — E861 Hypovolemia: Secondary | ICD-10-CM | POA: Diagnosis not present

## 2019-09-15 LAB — CBC
HCT: 33.7 % — ABNORMAL LOW (ref 36.0–46.0)
Hemoglobin: 10.7 g/dL — ABNORMAL LOW (ref 12.0–15.0)
MCH: 27.4 pg (ref 26.0–34.0)
MCHC: 31.8 g/dL (ref 30.0–36.0)
MCV: 86.4 fL (ref 80.0–100.0)
Platelets: 353 10*3/uL (ref 150–400)
RBC: 3.9 MIL/uL (ref 3.87–5.11)
RDW: 14.5 % (ref 11.5–15.5)
WBC: 12.9 10*3/uL — ABNORMAL HIGH (ref 4.0–10.5)
nRBC: 0 % (ref 0.0–0.2)

## 2019-09-15 LAB — BASIC METABOLIC PANEL
Anion gap: 13 (ref 5–15)
BUN: 7 mg/dL — ABNORMAL LOW (ref 8–23)
CO2: 25 mmol/L (ref 22–32)
Calcium: 8.9 mg/dL (ref 8.9–10.3)
Chloride: 94 mmol/L — ABNORMAL LOW (ref 98–111)
Creatinine, Ser: 0.76 mg/dL (ref 0.44–1.00)
GFR calc Af Amer: >60 mL/min (ref >60)
GFR calc non Af Amer: >60 mL/min (ref >60)
Glucose, Bld: 119 mg/dL — ABNORMAL HIGH (ref 70–99)
Potassium: 3.3 mmol/L — ABNORMAL LOW (ref 3.5–5.1)
Sodium: 132 mmol/L — ABNORMAL LOW (ref 135–145)

## 2019-09-15 LAB — GLUCOSE, CAPILLARY
Glucose-Capillary: 135 mg/dL — ABNORMAL HIGH (ref 70–99)
Glucose-Capillary: 167 mg/dL — ABNORMAL HIGH (ref 70–99)
Glucose-Capillary: 156 mg/dL — ABNORMAL HIGH (ref 70–99)
Glucose-Capillary: 171 mg/dL — ABNORMAL HIGH (ref 70–99)

## 2019-09-15 NOTE — Progress Notes (Signed)
   Subjective: 1 Day Post-Op Procedure(s) (LRB): TOTAL KNEE ARTHROPLASTY (Right)  Pt doing well C/o moderate pain this morning Walked yesterday well Denies any new symptoms or issues Patient reports pain as moderate.  Objective:   VITALS:   Vitals:   09/15/19 0116 09/15/19 0442  BP: 119/64 128/62  Pulse: 82 73  Resp: 16 16  Temp: 98.7 F (37.1 C) 98.9 F (37.2 C)  SpO2: 99% 100%    Right knee dressing intact nv intact distally No rashes or edema  LABS Recent Labs    09/15/19 0411  HGB 10.7*  HCT 33.7*  WBC 12.9*  PLT 353    Recent Labs    09/15/19 0411  NA 132*  K 3.3*  BUN 7*  CREATININE 0.76  GLUCOSE 119*     Assessment/Plan: 1 Day Post-Op Procedure(s) (LRB): TOTAL KNEE ARTHROPLASTY (Right) PT/OT  D/c planning Pulmonary toilet Pain management as needed     Brad Luna [place], MPAS [facility] is now [employer] [facility] [address], [place], [place], [place] [address] Phone: [phone] [email or web address] Facebook  [employer]

## 2019-09-15 NOTE — Progress Notes (Signed)
Increased Temp and HR causing a yellow MEWS score.  Assessed patient.   No change in mentation or affect.  Implemented q 2 hr VS x 2.  Medicated with Tylenol for fever. Encouraged use of IS, but she is very poor with it and needs much encouragement.

## 2019-09-15 NOTE — Progress Notes (Signed)
Physical Therapy Treatment Patient Details Name: Kelli Sandoval MRN: [id number] DOB: 1946-04-14 Today's Date: 09/15/2019    History of Present Illness 73 yo female s/p R TKA 10/9.    PT Comments    Pt refused OOB/ambulation this session. Performed a few knee ROM exercises with encouragement. Pt continues to c/o significant pain. Will continue to progress activity as able. Pt lives alone. She is currently not safe to d/c home alone.     Follow Up Recommendations  Follow surgeon's recommendation for DC plan and follow-up therapies;Supervision/Assistance - 24 hour(may need SNF-not progressing well enough to be home alone)     Equipment Recommendations  Rolling walker with 5" wheels;3in1 (PT)    Recommendations for Other Services       Precautions / Restrictions Precautions Precautions: Fall;Knee Required Braces or Orthoses: Knee Immobilizer - Right Knee Immobilizer - Right: On except when in CPM Restrictions Weight Bearing Restrictions: No RLE Weight Bearing: Weight bearing as tolerated    Mobility  Bed Mobility               General bed mobility comments: NT-pt refused OOB/ambulation  Transfers                    Ambulation/Gait                 Stairs             Wheelchair Mobility    Modified Rankin (Stroke Patients Only)       Balance                                            Cognition Arousal/Alertness: Awake/alert Behavior During Therapy: WFL for tasks assessed/performed Overall Cognitive Status: Within Functional Limits for tasks assessed                                        Exercises Total Joint Exercises Ankle Circles/Pumps: AROM;Both;10 reps;Supine Heel Slides: AAROM;Right;10 reps;Supine Hip ABduction/ADduction: AAROM;Right;10 reps;Supine Straight Leg Raises: AAROM;Right;5 reps;Supine Goniometric ROM: ~10-40 degrees    General Comments        Pertinent Vitals/Pain  Pain Assessment: Faces Faces Pain Scale: Hurts whole lot Pain Location: R knee Pain Descriptors / Indicators: Aching;Sore;Discomfort;Grimacing Pain Intervention(s): Limited activity within patient's tolerance;Monitored during session;Ice applied    Home Living                      Prior Function            PT Goals (current goals can now be found in the care plan section) Progress towards PT goals: Not progressing toward goals - comment(limited by pain, fatigue)    Frequency    7X/week      PT Plan Current plan remains appropriate    Co-evaluation              AM-PAC PT "6 Clicks" Mobility   Outcome Measure  Help needed turning from your back to your side while in a flat bed without using bedrails?: A Little Help needed moving from lying on your back to sitting on the side of a flat bed without using bedrails?: A Little Help needed moving to and from a bed to a chair (including a wheelchair)?: A Little Help  needed standing up from a chair using your arms (e.g., wheelchair or bedside chair)?: A Little Help needed to walk in hospital room?: A Lot Help needed climbing 3-5 steps with a railing? : A Lot 6 Click Score: 16    End of Session Equipment Utilized During Treatment: Gait belt;Right knee immobilizer Activity Tolerance: Patient limited by fatigue;Patient limited by pain Patient left: in bed;with call bell/phone within reach;with bed alarm set   PT Visit Diagnosis: Difficulty in walking, not elsewhere classified (R26.2);Other abnormalities of gait and mobility (R26.89);Pain Pain - Right/Left: Right Pain - part of body: Knee     Time: [id number] PT Time Calculation (min) (ACUTE ONLY): 9 min  Charges:  $Therapeutic Exercise: 8-22 mins             Weston Anna, PT Acute Rehabilitation Services Pager: [phone] Office: [phone]

## 2019-09-15 NOTE — Progress Notes (Signed)
Physical Therapy Treatment Patient Details Name: Kelli Sandoval MRN: [id number] DOB: [birth year] Today's Date: 09/15/2019    History of Present Illness 73 yo female s/p R TKA 10/9.    PT Comments    Pt is not progressing well this session. She was only able to tolerate walking ~15 feet. She c/o pain and fatigue. Discussed d/c plan-she stated her daughters are not able to provide 24/7 supervision. Will continue to follow and progress activity as tolerated. May need ST rehab placement if pt does not progress well.     Follow Up Recommendations  Follow surgeon's recommendation for DC plan and follow-up therapies;Supervision/Assistance - 24 hour(may need to consider SNF placement. Pt lives alone)     Clinical biochemist with 5" wheels;3in1 (PT)    Recommendations for Other Services       Precautions / Restrictions Precautions Precautions: Fall;Knee Required Braces or Orthoses: Knee Immobilizer - Right Knee Immobilizer - Right: On except when in CPM Restrictions Weight Bearing Restrictions: No RLE Weight Bearing: Weight bearing as tolerated    Mobility  Bed Mobility Overal bed mobility: Needs Assistance Bed Mobility: Supine to Sit     Supine to sit: Min assist;HOB elevated     General bed mobility comments: assist with RLE. increased time. cues for technique  Transfers Overall transfer level: Needs assistance Equipment used: Rolling walker (2 wheeled) Transfers: Sit to/from Stand Sit to Stand: Min assist         General transfer comment: Increased time. Assist to rise, stabilize, control descent. VCs safety, technique, hand/LE placement.  Ambulation/Gait Ambulation/Gait assistance: Min assist Gait Distance (Feet): 15 Feet Assistive device: Rolling walker (2 wheeled) Gait Pattern/deviations: Step-to pattern;Antalgic     General Gait Details: VCs safety, technique, sequence. Very slow gait speed. Pt c/o pain and fatigue-only able to  tolerate short distance this session. Followed with recliner.   Stairs             Wheelchair Mobility    Modified Rankin (Stroke Patients Only)       Balance Overall balance assessment: Needs assistance         Standing balance support: Bilateral upper extremity supported Standing balance-Leahy Scale: Poor                              Cognition Arousal/Alertness: Awake/alert Behavior During Therapy: WFL for tasks assessed/performed Overall Cognitive Status: Within Functional Limits for tasks assessed                                        Exercises      General Comments        Pertinent Vitals/Pain Pain Assessment: Faces Faces Pain Scale: Hurts whole lot Pain Location: R knee Pain Descriptors / Indicators: Aching;Sore;Discomfort;Grimacing Pain Intervention(s): Limited activity within patient's tolerance;Monitored during session;Repositioned    Home Living                      Prior Function            PT Goals (current goals can now be found in the care plan section) Progress towards PT goals: Progressing toward goals    Frequency    7X/week      PT Plan Current plan remains appropriate    Co-evaluation  AM-PAC PT "6 Clicks" Mobility   Outcome Measure  Help needed turning from your back to your side while in a flat bed without using bedrails?: A Little Help needed moving from lying on your back to sitting on the side of a flat bed without using bedrails?: A Little Help needed moving to and from a bed to a chair (including a wheelchair)?: A Little Help needed standing up from a chair using your arms (e.g., wheelchair or bedside chair)?: A Little Help needed to walk in hospital room?: A Little Help needed climbing 3-5 steps with a railing? : A Lot 6 Click Score: 17    End of Session Equipment Utilized During Treatment: Gait belt;Right knee immobilizer Activity Tolerance: Patient  limited by fatigue;Patient limited by pain Patient left: in chair;with call bell/phone within reach;with chair alarm set   PT Visit Diagnosis: Difficulty in walking, not elsewhere classified (R26.2)     Time: [id number] PT Time Calculation (min) (ACUTE ONLY): 22 min  Charges:  $Gait Training: 8-22 mins               Weston Anna, [place] Pager: [phone] Office: [phone]

## 2019-09-16 LAB — CBC
HCT: 30.9 % — ABNORMAL LOW (ref 36.0–46.0)
Hemoglobin: 10 g/dL — ABNORMAL LOW (ref 12.0–15.0)
MCH: 27.6 pg (ref 26.0–34.0)
MCHC: 32.4 g/dL (ref 30.0–36.0)
MCV: 85.4 fL (ref 80.0–100.0)
Platelets: 372 10*3/uL (ref 150–400)
RBC: 3.62 MIL/uL — ABNORMAL LOW (ref 3.87–5.11)
RDW: 14.7 % (ref 11.5–15.5)
WBC: 18.3 10*3/uL — ABNORMAL HIGH (ref 4.0–10.5)
nRBC: 0 % (ref 0.0–0.2)

## 2019-09-16 LAB — GLUCOSE, CAPILLARY
Glucose-Capillary: 152 mg/dL — ABNORMAL HIGH (ref 70–99)
Glucose-Capillary: 138 mg/dL — ABNORMAL HIGH (ref 70–99)
Glucose-Capillary: 136 mg/dL — ABNORMAL HIGH (ref 70–99)
Glucose-Capillary: 129 mg/dL — ABNORMAL HIGH (ref 70–99)

## 2019-09-16 NOTE — Progress Notes (Signed)
Physical Therapy Treatment Patient Details Name: Kelli Sandoval MRN: [id number] DOB: June 25, 1946 Today's Date: 09/16/2019    History of Present Illness 73 yo female s/p R TKA 10/9.    PT Comments    Pt is not progressing with mobility. Again, she only tolerated walking ~15 feet this session. Will continue to progress activity as tolerated. Pt has not met any PT goals at this time. She is not able to demonstrate ability to manage at home alone.     Follow Up Recommendations  Follow surgeon's recommendation for DC plan and follow-up therapies;Supervision/Assistance - 24 hour(may need to SNF)     Equipment Recommendations  Rolling walker with 5" wheels;3in1 (PT)    Recommendations for Other Services       Precautions / Restrictions Precautions Precautions: Knee;Fall Required Braces or Orthoses: Knee Immobilizer - Right Knee Immobilizer - Right: On except when in CPM Restrictions Weight Bearing Restrictions: No RLE Weight Bearing: Weight bearing as tolerated    Mobility  Bed Mobility Overal bed mobility: Needs Assistance Bed Mobility: Supine to Sit     Supine to sit: Min assist;HOB elevated     General bed mobility comments: Assist for R LE. Increased time. Cues for technique  Transfers Overall transfer level: Needs assistance Equipment used: Rolling walker (2 wheeled) Transfers: Sit to/from Stand Sit to Stand: Min assist;From elevated surface         General transfer comment: Increased time. Assist to rise, stabilize, control descent. VCs safety, technique, hand/LE placement.  Ambulation/Gait Ambulation/Gait assistance: Min assist Gait Distance (Feet): 15 Feet Assistive device: Rolling walker (2 wheeled) Gait Pattern/deviations: Step-to pattern;Antalgic     General Gait Details: Very slow gait speed with short step lengths bilaterally. Cues for safety, technique, sequence. Followed with recliner for safety. Pt c/o "feeling hot" and requested to sit  down.   Stairs             Wheelchair Mobility    Modified Rankin (Stroke Patients Only)       Balance Overall balance assessment: Needs assistance         Standing balance support: Bilateral upper extremity supported Standing balance-Leahy Scale: Poor                              Cognition Arousal/Alertness: Awake/alert Behavior During Therapy: WFL for tasks assessed/performed Overall Cognitive Status: Within Functional Limits for tasks assessed                                        Exercises Total Joint Exercises Ankle Circles/Pumps: AROM;Both;10 reps;Supine Quad Sets: AROM;Both;10 reps Heel Slides: AAROM;Right;10 reps;Supine(pt used gait belt) Hip ABduction/ADduction: AAROM;Right;10 reps;Supine Straight Leg Raises: AAROM;Supine;10 reps(pt used gait belt) Goniometric ROM: ~10-45 degrees    General Comments        Pertinent Vitals/Pain Pain Assessment: Faces Pain Score: 7  Faces Pain Scale: Hurts even more Pain Location: R knee Pain Descriptors / Indicators: Aching;Discomfort Pain Intervention(s): Limited activity within patient's tolerance;Monitored during session;Ice applied;Repositioned    Home Living Family/patient expects to be discharged to:: Private residence Living Arrangements: Alone Available Help at Discharge: Family;Friend(s);Available PRN/intermittently Type of Home: Apartment Home Access: Stairs to enter Entrance Stairs-Rails: Right;Left;Can reach both Home Layout: One level Home Equipment: Grab bars - tub/shower      Prior Function Level of Independence: Independent  PT Goals (current goals can now be found in the care plan section) Progress towards PT goals: Progressing toward goals    Frequency    7X/week      PT Plan Current plan remains appropriate    Co-evaluation              AM-PAC PT "6 Clicks" Mobility   Outcome Measure  Help needed turning from your back to  your side while in a flat bed without using bedrails?: A Little Help needed moving from lying on your back to sitting on the side of a flat bed without using bedrails?: A Little Help needed moving to and from a bed to a chair (including a wheelchair)?: A Little Help needed standing up from a chair using your arms (e.g., wheelchair or bedside chair)?: A Little Help needed to walk in hospital room?: A Lot Help needed climbing 3-5 steps with a railing? : A Lot 6 Click Score: 16    End of Session Equipment Utilized During Treatment: Gait belt;Right knee immobilizer Activity Tolerance: Patient limited by fatigue;Patient limited by pain Patient left: in chair;with call bell/phone within reach;with chair alarm set   PT Visit Diagnosis: Difficulty in walking, not elsewhere classified (R26.2);Other abnormalities of gait and mobility (R26.89);Pain Pain - Right/Left: Right Pain - part of body: Knee     Time: 0936-1000 PT Time Calculation (min) (ACUTE ONLY): 24 min  Charges:  $Gait Training: 8-22 mins $Therapeutic Exercise: 8-22 mins                        Weston Anna, PT Acute Rehabilitation Services Pager: [phone] Office: [phone]

## 2019-09-16 NOTE — TOC Initial Note (Signed)
Transition of Care [facility]) - Initial/Assessment Note    Patient Details  Name: Kelli Sandoval MRN: [id number] Date of Birth: 04-03-1946  Transition of Care [facility]) CM/SW Contact:    Greg Cutter, LCSW Phone Number: 09/16/2019, 1:12 PM  Clinical Narrative:     LCSW received SW consult for SNF placement but per chart review, patient prefers to return home with [facility] TOC will continue to follow and will set up [facility] prior to expected discharge. Patient may need transportation assistance back home.    Expected Discharge Plan: [place]    Expected Discharge Plan and Services Expected Discharge Plan: Old Agency: PT [place]: [facility] (formerly [facility]) Date [place]: 09/14/19 Time HH Agency Contacted: 1501 Representative spoke with at [place]: [place] of Daily Living Home Assistive Devices/Equipment: None ADL Screening (condition at time of admission) Patient's cognitive ability adequate to safely complete daily activities?: Yes Is the patient deaf or have difficulty hearing?: No Does the patient have difficulty seeing, even when wearing glasses/contacts?: No Does the patient have difficulty concentrating, remembering, or making decisions?: No Patient able to express need for assistance with ADLs?: Yes Does the patient have difficulty dressing or bathing?: No Independently performs ADLs?: Yes (appropriate for developmental age) Does the patient have difficulty walking or climbing stairs?: Yes Weakness of Legs: Right Weakness of Arms/Hands: None  Admission diagnosis:  Right knee end stage osteoarthritis Patient Active Problem List   Diagnosis Date Noted  . Status post total knee replacement 09/15/2019  . Status post total knee replacement, right 09/14/2019  . Iron deficiency anemia 08/03/2017  . GASTROENTERITIS 01/06/2011  . HYPERLIPIDEMIA 11/10/2010  . VITAMIN D DEFICIENCY 10/06/2009  .  VAGINITIS, CANDIDAL 10/03/2009  . TRICHOMONAL VAGINITIS 10/03/2009  . SHOULDER PAIN, RIGHT 06/16/2009  . MUSCLE SPASM, TRAPEZIUS MUSCLE, RIGHT 04/28/2009  . GASTROENTERITIS, HX OF 01/22/2009  . ESSENTIAL HYPERTENSION, BENIGN 01/30/2008  . DEGENERATIVE JOINT DISEASE, RIGHT KNEE 01/30/2008  . 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 ALLERGIC RHINITIS 07/31/2007  . BURSITIS NOS 07/31/2007  . HYPOTHYROIDISM, UNSPECIFIED 02/02/2007  . TOBACCO DEPENDENCE 02/02/2007  . MIGRAINE, UNSPEC., W/O INTRACTABLE MIGRAINE 02/02/2007  . GLAUCOMA 02/02/2007  . CATARACT 02/02/2007  . COPD 02/02/2007  . BLOOD IN STOOL, MELENA 02/02/2007  . INCONTINENCE, STRESS, FEMALE 02/02/2007  . MENOPAUSAL SYNDROME 02/02/2007   PCP:  Donald Prose, MD Pharmacy:   [facility], [address] [place] [address] Phone: [phone] Fax: [phone]  Readmission Risk Interventions No flowsheet data found.  Eula Fried, BSW, MSW, LCSW

## 2019-09-16 NOTE — Progress Notes (Signed)
Physical Therapy Treatment Patient Details Name: Kelli Sandoval MRN: [id number] DOB: [birth year] Today's Date: 09/16/2019    History of Present Illness 73 yo female s/p R TKA 10/9.    PT Comments    Pt has been unable to meet PT goals on today. She only walked ~15 feet again this afternoon. Pt c/o "feeling hot' and fatigue. Assessed BP-112/63. Pt has not demonstrated the ability to safely manage at home alone. If she returns home, highly recommend 24/7 supervision/assistance. May need to consider ST rehab placement.     Follow Up Recommendations  Follow surgeon's recommendation for DC plan and follow-up therapies;Supervision/Assistance - 24 hour(may need SNF placement)     Equipment Recommendations       Recommendations for Other Services       Precautions / Restrictions Precautions Precautions: Fall;Knee Required Braces or Orthoses: Knee Immobilizer - Right Knee Immobilizer - Right: On except when in CPM Restrictions Weight Bearing Restrictions: No RLE Weight Bearing: Weight bearing as tolerated    Mobility  Bed Mobility Overal bed mobility: Needs Assistance Bed Mobility: Supine to Sit     Supine to sit: Min guard;HOB elevated     General bed mobility comments: Did not give assist for bed mobility on today to encourage pt to perform as much of task unassisted as possible. Pt used UEs to assist R LE off bed. Increased time.  Transfers Overall transfer level: Needs assistance Equipment used: Rolling walker (2 wheeled) Transfers: Sit to/from Stand Sit to Stand: Min assist;From elevated surface         General transfer comment: Increased time. Assist to rise, stabilize, control descent. VCs safety, technique, hand/LE placement.  Ambulation/Gait Ambulation/Gait assistance: Min assist Gait Distance (Feet): 15 Feet Assistive device: Rolling walker (2 wheeled) Gait Pattern/deviations: Step-to pattern     General Gait Details: Very slow gait speed with short  step lengths bilaterally. Cues for safety, technique, sequence, distance from RW, increased step lengths. Followed with recliner for safety. Pt c/o "feeling hot" and requested to sit down again this afternoon. Assessed BP-112/63   Stairs             Wheelchair Mobility    Modified Rankin (Stroke Patients Only)       Balance Overall balance assessment: Needs assistance         Standing balance support: Bilateral upper extremity supported Standing balance-Leahy Scale: Poor                              Cognition Arousal/Alertness: Awake/alert Behavior During Therapy: WFL for tasks assessed/performed Overall Cognitive Status: Within Functional Limits for tasks assessed                                        Exercises      General Comments        Pertinent Vitals/Pain Pain Assessment: Faces Faces Pain Scale: Hurts even more Pain Location: R knee Pain Descriptors / Indicators: Aching;Discomfort Pain Intervention(s): Limited activity within patient's tolerance;Monitored during session;Repositioned;Ice applied    Home Living                      Prior Function            PT Goals (current goals can now be found in the care plan section) Progress towards PT goals: Not progressing  toward goals - comment    Frequency    7X/week      PT Plan Current plan remains appropriate    Co-evaluation              AM-PAC PT "6 Clicks" Mobility   Outcome Measure  Help needed turning from your back to your side while in a flat bed without using bedrails?: A Little Help needed moving from lying on your back to sitting on the side of a flat bed without using bedrails?: A Little Help needed moving to and from a bed to a chair (including a wheelchair)?: A Little Help needed standing up from a chair using your arms (e.g., wheelchair or bedside chair)?: A Little Help needed to walk in hospital room?: A Lot Help needed climbing  3-5 steps with a railing? : A Lot 6 Click Score: 16    End of Session Equipment Utilized During Treatment: Gait belt;Right knee immobilizer Activity Tolerance: Patient limited by fatigue;Patient limited by pain Patient left: in chair;with call bell/phone within reach;with chair alarm set   PT Visit Diagnosis: Difficulty in walking, not elsewhere classified (R26.2);Other abnormalities of gait and mobility (R26.89);Pain Pain - Right/Left: Right Pain - part of body: Knee     Time: [id number] PT Time Calculation (min) (ACUTE ONLY): 17 min  Charges:  $Gait Training: 8-22 mins                       Weston Anna, PT Acute Rehabilitation Services Pager: [phone] Office: [phone]

## 2019-09-16 NOTE — Progress Notes (Signed)
Subjective: 2 Days Post-Op Procedure(s) (LRB): TOTAL KNEE ARTHROPLASTY (Right) Patient reports pain as 2 on 0-10 scale. Doing well. Dressing changed and her wound  looks fine.   Objective: Vital signs in last 24 hours: Temp:  [98.7 F (37.1 C)-101.6 F (38.7 C)] 98.7 F (37.1 C) (10/11 0553) Pulse Rate:  [79-102] 90 (10/11 0553) Resp:  [16-18] 16 (10/11 0553) BP: (114-153)/(57-66) 120/57 (10/11 0553) SpO2:  [84 %-98 %] 93 % (10/11 0553)  Intake/Output from previous day: 10/10 0701 - 10/11 0700 In: 728.7 [P.O.:360; I.V.:368.7] Out: 1000 [Urine:1000] Intake/Output this shift: No intake/output data recorded.  Recent Labs    09/15/19 0411 09/16/19 0308  HGB 10.7* 10.0*   Recent Labs    09/15/19 0411 09/16/19 0308  WBC 12.9* 18.3*  RBC 3.90 3.62*  HCT 33.7* 30.9*  PLT 353 372   Recent Labs    09/15/19 0411  NA 132*  K 3.3*  CL 94*  CO2 25  BUN 7*  CREATININE 0.76  GLUCOSE 119*  CALCIUM 8.9   No results for input(s): LABPT, INR in the last 72 hours.  Neurologically intact Dorsiflexion/Plantar flexion intact No cellulitis present   Assessment/Plan: 2 Days Post-Op Procedure(s) (LRB): TOTAL KNEE ARTHROPLASTY (Right) Up with therapy.      Latanya Maudlin 09/16/2019, 8:12 AM

## 2019-09-16 NOTE — Evaluation (Signed)
Occupational Therapy Evaluation Patient Details Name: Kelli Sandoval MRN: [id number] DOB: [birth year] Today's Date: 09/16/2019    History of Present Illness 73 yo female s/p R TKA 10/9.   Clinical Impression   Pt. Was seen for skilled OT to assess for OT Needs and patient safety. Pt. Is requiring up to Mod A with ADLs and is Min A with transfer to commode. Pt. States she does not have 24 hour care but she wants to d/c home. Patient may need ST SNF or family be able to provide 24/7 and physicial assist. Acute OT to follow.    Follow Up Recommendations  Follow surgeon's recommendation for DC plan and follow-up therapies    Equipment Recommendations  3 in 1 bedside commode    Recommendations for Other Services       Precautions / Restrictions Precautions Precautions: Knee;Fall Required Braces or Orthoses: Knee Immobilizer - Right Knee Immobilizer - Right: On except when in CPM Restrictions Weight Bearing Restrictions: No RLE Weight Bearing: Weight bearing as tolerated      Mobility Bed Mobility Overal bed mobility: Needs Assistance Bed Mobility: Supine to Sit     Supine to sit: Min assist;HOB elevated     General bed mobility comments: Assist for R LE. Increased time. Cues for technique  Transfers Overall transfer level: Needs assistance Equipment used: Rolling walker (2 wheeled) Transfers: Sit to/from Stand Sit to Stand: Min assist         General transfer comment: Increased time. Assist to rise, stabilize, control descent. VCs safety, technique, hand/LE placement.    Balance Overall balance assessment: Needs assistance         Standing balance support: Bilateral upper extremity supported Standing balance-Leahy Scale: Poor                             ADL either performed or assessed with clinical judgement   ADL Overall ADL's : Needs assistance/impaired Eating/Feeding: Independent   Grooming: Wash/dry hands;Wash/dry face;Min guard    Upper Body Bathing: Supervision/ safety;Set up;Sitting   Lower Body Bathing: Moderate assistance;Sit to/from stand   Upper Body Dressing : Minimal assistance;Sitting   Lower Body Dressing: Moderate assistance;With adaptive equipment;Sit to/from stand   Toilet Transfer: Minimal assistance;Ambulation   Toileting- Clothing Manipulation and Hygiene: Moderate assistance       Functional mobility during ADLs: Minimal assistance;Rolling walker General ADL Comments: patient ed on use of ae and dme for adls and will need additional training.     Vision Baseline Vision/History: Wears glasses Wears Glasses: Reading only Patient Visual Report: No change from baseline       Perception     Praxis      Pertinent Vitals/Pain Pain Assessment: Faces Pain Score: 7  Faces Pain Scale: Hurts even more Pain Location: R knee Pain Descriptors / Indicators: Aching;Discomfort Pain Intervention(s): Limited activity within patient's tolerance;Monitored during session;Ice applied;Repositioned     Hand Dominance Right   Extremity/Trunk Assessment Upper Extremity Assessment Upper Extremity Assessment: Overall WFL for tasks assessed           Communication Communication Communication: No difficulties   Cognition Arousal/Alertness: Awake/alert Behavior During Therapy: WFL for tasks assessed/performed Overall Cognitive Status: Within Functional Limits for tasks assessed                                     General Comments  Exercises Total Joint Exercises Ankle Circles/Pumps: AROM;Both;10 reps;Supine Quad Sets: AROM;Both;10 reps Heel Slides: AAROM;Right;10 reps;Supine(pt used gait belt) Hip ABduction/ADduction: AAROM;Right;10 reps;Supine Straight Leg Raises: AAROM;Supine;10 reps(pt used gait belt) Goniometric ROM: ~10-45 degrees   Shoulder Instructions      Home Living Family/patient expects to be discharged to:: Private residence Living Arrangements:  Alone Available Help at Discharge: Family;Friend(s);Available PRN/intermittently Type of Home: Apartment Home Access: Stairs to enter Entrance Stairs-Number of Steps: 5 Entrance Stairs-Rails: Right;Left;Can reach both Home Layout: One level     Bathroom Shower/Tub: Teacher, early years/pre: Standard     Home Equipment: Grab bars - tub/shower          Prior Functioning/Environment Level of Independence: Independent                 OT Problem List: Decreased activity tolerance;Decreased coordination      OT Treatment/Interventions: Self-care/ADL training;DME and/or AE instruction;Therapeutic activities;Patient/family education    OT Goals(Current goals can be found in the care plan section) Acute Rehab OT Goals Patient Stated Goal: get better OT Goal Formulation: With patient Time For Goal Achievement: 09/30/19 Potential to Achieve Goals: Good  OT Frequency: Min 2X/week   Barriers to D/C: Decreased caregiver support          Co-evaluation              AM-PAC OT "6 Clicks" Daily Activity     Outcome Measure Help from another person eating meals?: None Help from another person taking care of personal grooming?: A Little Help from another person toileting, which includes using toliet, bedpan, or urinal?: A Little Help from another person bathing (including washing, rinsing, drying)?: A Lot Help from another person to put on and taking off regular upper body clothing?: A Little Help from another person to put on and taking off regular lower body clothing?: A Lot 6 Click Score: 17   End of Session Equipment Utilized During Treatment: Gait belt;Rolling walker Nurse Communication: (ok therapy)  Activity Tolerance: Patient tolerated treatment well Patient left:    OT Visit Diagnosis: Unsteadiness on feet (R26.81)                Time: 1023-1110 OT Time Calculation (min): 47 min Charges:  OT General Charges $OT Visit: 1 Visit OT Evaluation $OT  Eval Low Complexity: 1 Low OT Treatments $Self Care/Home Management : [id number] mins  6 clicks  Machael Raine 09/16/2019, 11:11 AM

## 2019-09-16 NOTE — Plan of Care (Signed)
Pt. Was seen for skilled OT to assess for OT Needs and patient safety. Pt. Is requiring up to Mod A with ADLs and is Min A with transfer to commode. Pt. States she does not have 24 hour care but she wants to d/c home. Patient may need ST SNF or family be able to provide 24/7 and physicial assist. Acute OT to follow.

## 2019-09-17 ENCOUNTER — Encounter (HOSPITAL_COMMUNITY): Payer: Self-pay | Admitting: Orthopedic Surgery

## 2019-09-17 LAB — CBC
HCT: 30.4 % — ABNORMAL LOW (ref 36.0–46.0)
Hemoglobin: 9.9 g/dL — ABNORMAL LOW (ref 12.0–15.0)
MCH: 27.7 pg (ref 26.0–34.0)
MCHC: 32.6 g/dL (ref 30.0–36.0)
MCV: 84.9 fL (ref 80.0–100.0)
Platelets: 335 10*3/uL (ref 150–400)
RBC: 3.58 MIL/uL — ABNORMAL LOW (ref 3.87–5.11)
RDW: 14.6 % (ref 11.5–15.5)
WBC: 17.7 10*3/uL — ABNORMAL HIGH (ref 4.0–10.5)
nRBC: 0 % (ref 0.0–0.2)

## 2019-09-17 LAB — GLUCOSE, CAPILLARY
Glucose-Capillary: 144 mg/dL — ABNORMAL HIGH (ref 70–99)
Glucose-Capillary: 127 mg/dL — ABNORMAL HIGH (ref 70–99)
Glucose-Capillary: 128 mg/dL — ABNORMAL HIGH (ref 70–99)
Glucose-Capillary: 120 mg/dL — ABNORMAL HIGH (ref 70–99)

## 2019-09-17 LAB — BASIC METABOLIC PANEL
Anion gap: 14 (ref 5–15)
BUN: 13 mg/dL (ref 8–23)
CO2: 26 mmol/L (ref 22–32)
Calcium: 8.3 mg/dL — ABNORMAL LOW (ref 8.9–10.3)
Chloride: 87 mmol/L — ABNORMAL LOW (ref 98–111)
Creatinine, Ser: 0.82 mg/dL (ref 0.44–1.00)
GFR calc Af Amer: >60 mL/min (ref >60)
GFR calc non Af Amer: >60 mL/min (ref >60)
Glucose, Bld: 139 mg/dL — ABNORMAL HIGH (ref 70–99)
Potassium: 2.8 mmol/L — ABNORMAL LOW (ref 3.5–5.1)
Sodium: 127 mmol/L — ABNORMAL LOW (ref 135–145)

## 2019-09-17 MED ORDER — POTASSIUM CHLORIDE 10 MEQ/100ML IV SOLN
10.0000 meq | INTRAVENOUS | Status: AC
  Administered 2019-09-17 (×2): 10 meq via INTRAVENOUS
  Filled 2019-09-17 (×2): qty 100

## 2019-09-17 MED ORDER — BISACODYL 10 MG RE SUPP
10.0000 mg | Freq: Once | RECTAL | Status: AC
  Administered 2019-09-17: 18:00:00 10 mg via RECTAL
  Filled 2019-09-17: qty 1

## 2019-09-17 MED ORDER — SODIUM CHLORIDE 0.9 % IV SOLN
INTRAVENOUS | Status: DC
  Administered 2019-09-17: 17:00:00 via INTRAVENOUS

## 2019-09-17 NOTE — Progress Notes (Addendum)
   Subjective: 3 Days Post-Op Procedure(s) (LRB): TOTAL KNEE ARTHROPLASTY (Right)  Pt c/o nausea with therapy this morning States she feels ok while she is walking Pain is mild to moderate Denies any new symptoms or issues Patient reports pain as moderate.  Objective:   VITALS:   Vitals:   09/17/19 0512 09/17/19 0826  BP: 127/65 (!) 111/95  Pulse: 94 96  Resp: 18   Temp: 100.1 F (37.8 C)   SpO2: 90% 90%    Right knee incision healing well nv intact distally Guarded rom  LABS Recent Labs    09/15/19 0411 09/16/19 0308  HGB 10.7* 10.0*  HCT 33.7* 30.9*  WBC 12.9* 18.3*  PLT 353 372    Recent Labs    09/15/19 0411  NA 132*  K 3.3*  BUN 7*  CREATININE 0.76  GLUCOSE 119*     Assessment/Plan: 3 Days Post-Op Procedure(s) (LRB): TOTAL KNEE ARTHROPLASTY (Right) Will recheck labs today Possible d/c tomorrow Will discuss home health and continue therapy  Pain management as needed    Brad Luna [place], [place] [facility] now [facility]  [place] [address], [place], [place], [place] [address] Phone: [phone] [email or web address] Facebook  [employer]     Patient seen and agree with the above assessment and plan. The patient has hyponatremia and hypokalemia.  Will replace IV and hopefully once she has a BM(suppository) her po intake will improve and her electrolytes will normalize. Continue OOB with therapy.  D/C plan still in question with therapy thinking SNF but the patient wanting to go home.  Netta Cedars, MD

## 2019-09-17 NOTE — Progress Notes (Signed)
Physical Therapy Treatment Patient Details Name: Kelli Sandoval MRN: [id number] DOB: 1946/11/06 Today's Date: 09/17/2019    History of Present Illness 73 yo female s/p R TKA 10/9.    PT Comments    Mod encouragement for pt participation. She is slowly progressing but still hasn't demonstrated ability to d/c home alone. She is not motivated to ambulate or perform exercises. Pt was only able to tolerate ambulating ~40 feet this morning. She c/o nausea during session. If pt returns, home recommendation is for 24/7 supervision/assist.     Follow Up Recommendations  Follow surgeon's recommendation for DC plan and follow-up therapies;Supervision/Assistance - 24 hour(may need SNF. Unsure if pt can manage at home alone. Progressing slowly/poorly)     Equipment Recommendations  Rolling walker with 5" wheels;3in1 (PT)    Recommendations for Other Services       Precautions / Restrictions Precautions Precautions: Fall;Knee Required Braces or Orthoses: Knee Immobilizer - Right Knee Immobilizer - Right: On except when in CPM Restrictions Weight Bearing Restrictions: No RLE Weight Bearing: Weight bearing as tolerated    Mobility  Bed Mobility Overal bed mobility: Needs Assistance Bed Mobility: Supine to Sit;Sit to Supine     Supine to sit: Min guard Sit to supine: Min assist   General bed mobility comments: Assist for R LE . Cues for technique. Increased time  Transfers Overall transfer level: Needs assistance Equipment used: Rolling walker (2 wheeled) Transfers: Sit to/from Stand Sit to Stand: Min guard;From elevated surface         General transfer comment: Increased time. Close guard for safety. VCs safety, hand placement.  Ambulation/Gait Ambulation/Gait assistance: Min assist Gait Distance (Feet): 40 Feet Assistive device: Rolling walker (2 wheeled) Gait Pattern/deviations: Step-to pattern     General Gait Details: Very slow gait speed with short step lengths  bilaterally. Cues for safety, technique, sequence, distance from RW, increased step lengths. Followed with recliner for safety. Pt c/o feeling nauseous and requested to return to the room.   Stairs             Wheelchair Mobility    Modified Rankin (Stroke Patients Only)       Balance Overall balance assessment: Needs assistance         Standing balance support: Bilateral upper extremity supported Standing balance-Leahy Scale: Poor                              Cognition Arousal/Alertness: Awake/alert Behavior During Therapy: Flat affect Overall Cognitive Status: Within Functional Limits for tasks assessed                                        Exercises Total Joint Exercises Ankle Circles/Pumps: AROM;Both;10 reps;Supine Short Arc Quad: AAROM;Right;10 reps;Supine Heel Slides: AAROM;Right;10 reps;Supine Hip ABduction/ADduction: AAROM;Right;10 reps;Supine Straight Leg Raises: AAROM;Supine;10 reps Goniometric ROM: ~10-45 degrees    General Comments        Pertinent Vitals/Pain Pain Assessment: Faces Faces Pain Scale: Hurts even more Pain Descriptors / Indicators: Aching;Discomfort Pain Intervention(s): Limited activity within patient's tolerance;Ice applied;Monitored during session    Home Living                      Prior Function            PT Goals (current goals can now be found in the  care plan section) Progress towards PT goals: Progressing toward goals(very slowly)    Frequency    7X/week      PT Plan Current plan remains appropriate    Co-evaluation              AM-PAC PT "6 Clicks" Mobility   Outcome Measure  Help needed turning from your back to your side while in a flat bed without using bedrails?: A Little Help needed moving from lying on your back to sitting on the side of a flat bed without using bedrails?: A Little Help needed moving to and from a bed to a chair (including a  wheelchair)?: A Little Help needed standing up from a chair using your arms (e.g., wheelchair or bedside chair)?: A Little Help needed to walk in hospital room?: A Little Help needed climbing 3-5 steps with a railing? : A Lot 6 Click Score: 17    End of Session Equipment Utilized During Treatment: Gait belt;Right knee immobilizer Activity Tolerance: Patient limited by fatigue;Patient limited by pain Patient left: in bed;with call bell/phone within reach;with bed alarm set   PT Visit Diagnosis: Difficulty in walking, not elsewhere classified (R26.2);Other abnormalities of gait and mobility (R26.89);Pain Pain - Right/Left: Right Pain - part of body: Knee     Time: 1001-1034 PT Time Calculation (min) (ACUTE ONLY): 33 min  Charges:  $Gait Training: 8-22 mins $Therapeutic Exercise: 8-22 mins                        Weston Anna, PT Acute Rehabilitation Services Pager: [phone] Office: [phone]

## 2019-09-17 NOTE — Progress Notes (Signed)
Physical Therapy Treatment Patient Details Name: Kelli Sandoval MRN: [id number] DOB: 10-13-1946 Today's Date: 09/17/2019    History of Present Illness 72 yo female s/p R TKA 10/9.    PT Comments    Mod encouragement for participation. Pt was only agreeable to ambulate ~25 feet this session. Discussed d/c plan-continue to recommend 24/7 supervision. Informed pt that we will need to practice stair negotiation on tomorrow since plan is for possible d/c tomorrow.     Follow Up Recommendations  Follow surgeon's recommendation for DC plan and follow-up therapies;Supervision/Assistance - 24 hour(pt declines SNF placement)     Equipment Recommendations  Rolling walker with 5" wheels;3in1 (PT)    Recommendations for Other Services       Precautions / Restrictions Precautions Precautions: Fall;Knee Required Braces or Orthoses: Knee Immobilizer - Right Knee Immobilizer - Right: On except when in CPM Restrictions Weight Bearing Restrictions: No RLE Weight Bearing: Weight bearing as tolerated    Mobility  Bed Mobility Overal bed mobility: Needs Assistance Bed Mobility: Supine to Sit;Sit to Supine     Supine to sit: Min assist Sit to supine: Min assist   General bed mobility comments: Assist for R LE . Cues for technique. Increased time  Transfers Overall transfer level: Needs assistance Equipment used: Rolling walker (2 wheeled) Transfers: Sit to/from Stand Sit to Stand: Min guard;From elevated surface         General transfer comment: Increased time. Close guard for safety. VCs safety, hand placement.  Ambulation/Gait Ambulation/Gait assistance: Min assist; Min guard assist Gait Distance (Feet): 25 Feet Assistive device: Rolling walker (2 wheeled) Gait Pattern/deviations: Step-through pattern;Step-to pattern;Decreased stride length     General Gait Details: Very slow gait speed with short step lengths bilaterally. Cues for safety, technique, sequence, distance  from RW, increased step lengths. Pt declined to ambulate further and requested to return to room   Stairs             Wheelchair Mobility    Modified Rankin (Stroke Patients Only)       Balance Overall balance assessment: Needs assistance         Standing balance support: Bilateral upper extremity supported Standing balance-Leahy Scale: Poor                              Cognition Arousal/Alertness: Awake/alert Behavior During Therapy: Flat affect Overall Cognitive Status: Within Functional Limits for tasks assessed                                        Exercises      General Comments        Pertinent Vitals/Pain Pain Assessment: Faces Pain Score: 5  Faces Pain Scale: Hurts even more Pain Location: R knee Pain Descriptors / Indicators: Aching;Discomfort Pain Intervention(s): Limited activity within patient's tolerance;Monitored during session;Repositioned;Ice applied    Home Living                      Prior Function            PT Goals (current goals can now be found in the care plan section) Progress towards PT goals: Progressing toward goals(very slowly)    Frequency    7X/week      PT Plan Current plan remains appropriate    Co-evaluation  AM-PAC PT "6 Clicks" Mobility   Outcome Measure  Help needed turning from your back to your side while in a flat bed without using bedrails?: A Little Help needed moving from lying on your back to sitting on the side of a flat bed without using bedrails?: A Little Help needed moving to and from a bed to a chair (including a wheelchair)?: A Little Help needed standing up from a chair using your arms (e.g., wheelchair or bedside chair)?: A Little Help needed to walk in hospital room?: A Little Help needed climbing 3-5 steps with a railing? : A Lot 6 Click Score: 17    End of Session Equipment Utilized During Treatment: Gait belt;Right knee  immobilizer Activity Tolerance: Patient limited by fatigue(limited by nausea) Patient left: in bed;with call bell/phone within reach;with bed alarm set   PT Visit Diagnosis: Difficulty in walking, not elsewhere classified (R26.2);Other abnormalities of gait and mobility (R26.89);Pain Pain - Right/Left: Right Pain - part of body: Knee     Time: [id number] PT Time Calculation (min) (ACUTE ONLY): 25 min  Charges:  $Gait Training: 8-22 mins $Therapeutic Activity: 8-22 mins              Weston Anna, PT Acute Rehabilitation Services Pager: [phone] Office: [phone]

## 2019-09-17 NOTE — Progress Notes (Signed)
Occupational Therapy Treatment Patient Details Name: Kelli Sandoval MRN: [id number] DOB: March 27, 1946 Today's Date: 09/17/2019    History of present illness 73 yo female s/p R TKA 10/9.   OT comments  Pain limiting pt.  Pt with very flat affect and unsure about A at home  Follow Up Recommendations  Follow surgeon's recommendation for DC plan and follow-up therapies;SNF;Home health OT;Supervision/Assistance - 24 hour(may need SNF if not enough A at home. States her daughter has back problems)    Equipment Recommendations  3 in 1 bedside commode    Recommendations for Other Services      Precautions / Restrictions Precautions Precautions: Fall;Knee Required Braces or Orthoses: Knee Immobilizer - Right Knee Immobilizer - Right: On except when in CPM Restrictions Weight Bearing Restrictions: No RLE Weight Bearing: Weight bearing as tolerated       Mobility Bed Mobility Overal bed mobility: Needs Assistance Bed Mobility: Supine to Sit;Sit to Supine     Supine to sit: Min guard Sit to supine: Min assist   General bed mobility comments: Assist for R LE . Cues for technique. Increased time  Transfers Overall transfer level: Needs assistance Equipment used: Rolling walker (2 wheeled) Transfers: Sit to/from Stand Sit to Stand: Min guard;From elevated surface         General transfer comment: Increased time. Close guard for safety. VCs safety, hand placement.    Balance Overall balance assessment: Needs assistance         Standing balance support: Bilateral upper extremity supported Standing balance-Leahy Scale: Poor                             ADL either performed or assessed with clinical judgement   ADL Overall ADL's : Needs assistance/impaired     Grooming: Set up;Sitting               Lower Body Dressing: Moderate assistance;With adaptive equipment;Sit to/from stand   Toilet Transfer: Minimal assistance;Stand-pivot;Cueing for  safety;BSC   Toileting- Clothing Manipulation and Hygiene: Minimal assistance;Sit to/from stand               Vision Baseline Vision/History: Wears glasses Wears Glasses: Reading only            Cognition Arousal/Alertness: Awake/alert Behavior During Therapy: Flat affect Overall Cognitive Status: Within Functional Limits for tasks assessed                                                     Pertinent Vitals/ Pain       Pain Assessment: 0-10 Pain Score: 5  Faces Pain Scale: Hurts even more Pain Location: R knee Pain Descriptors / Indicators: Aching;Discomfort Pain Intervention(s): Limited activity within patient's tolerance;Monitored during session;Premedicated before session;Repositioned         Frequency  Min 2X/week        Progress Toward Goals  OT Goals(current goals can now be found in the care plan section)  Progress towards OT goals: Progressing toward goals     Plan         AM-PAC OT "6 Clicks" Daily Activity     Outcome Measure   Help from another person eating meals?: None Help from another person taking care of personal grooming?: A Little Help from another person toileting, which includes using  toliet, bedpan, or urinal?: A Little Help from another person bathing (including washing, rinsing, drying)?: A Lot Help from another person to put on and taking off regular upper body clothing?: A Little Help from another person to put on and taking off regular lower body clothing?: A Lot 6 Click Score: 17    End of Session Equipment Utilized During Treatment: Gait belt;Rolling walker  OT Visit Diagnosis: Unsteadiness on feet (R26.81)   Activity Tolerance Patient tolerated treatment well   Patient Left in bed;with call bell/phone within reach   Nurse Communication Mobility status(ok therapy)        Time: 1035-1050 OT Time Calculation (min): 15 min  Charges: OT General Charges $OT Visit: 1 Visit OT  Treatments $Self Care/Home Management : 8-22 mins  Kari Baars, OT Acute Rehabilitation Services [phone] Office- [phone]      [facility] [place], Edwena Felty D 09/17/2019, 1:19 PM

## 2019-09-18 ENCOUNTER — Inpatient Hospital Stay (HOSPITAL_COMMUNITY): Payer: Medicare Other

## 2019-09-18 DIAGNOSIS — E876 Hypokalemia: Secondary | ICD-10-CM | POA: Diagnosis present

## 2019-09-18 DIAGNOSIS — D72829 Elevated white blood cell count, unspecified: Secondary | ICD-10-CM | POA: Diagnosis present

## 2019-09-18 DIAGNOSIS — E1169 Type 2 diabetes mellitus with other specified complication: Secondary | ICD-10-CM | POA: Diagnosis present

## 2019-09-18 DIAGNOSIS — E871 Hypo-osmolality and hyponatremia: Secondary | ICD-10-CM | POA: Diagnosis present

## 2019-09-18 DIAGNOSIS — R609 Edema, unspecified: Secondary | ICD-10-CM

## 2019-09-18 LAB — CBC WITH DIFFERENTIAL/PLATELET
Abs Immature Granulocytes: 0.1 10*3/uL — ABNORMAL HIGH (ref 0.00–0.07)
Basophils Absolute: 0 10*3/uL (ref 0.0–0.1)
Basophils Relative: 0 %
Eosinophils Absolute: 0 10*3/uL (ref 0.0–0.5)
Eosinophils Relative: 0 %
HCT: 28.5 % — ABNORMAL LOW (ref 36.0–46.0)
Hemoglobin: 9.5 g/dL — ABNORMAL LOW (ref 12.0–15.0)
Immature Granulocytes: 1 %
Lymphocytes Relative: 12 %
Lymphs Abs: 1.6 10*3/uL (ref 0.7–4.0)
MCH: 27.9 pg (ref 26.0–34.0)
MCHC: 33.3 g/dL (ref 30.0–36.0)
MCV: 83.8 fL (ref 80.0–100.0)
Monocytes Absolute: 1.2 10*3/uL — ABNORMAL HIGH (ref 0.1–1.0)
Monocytes Relative: 9 %
Neutro Abs: 10.5 10*3/uL — ABNORMAL HIGH (ref 1.7–7.7)
Neutrophils Relative %: 78 %
Platelets: 345 10*3/uL (ref 150–400)
RBC: 3.4 MIL/uL — ABNORMAL LOW (ref 3.87–5.11)
RDW: 14.6 % (ref 11.5–15.5)
WBC: 13.3 10*3/uL — ABNORMAL HIGH (ref 4.0–10.5)
nRBC: 0 % (ref 0.0–0.2)

## 2019-09-18 LAB — BASIC METABOLIC PANEL
Anion gap: 14 (ref 5–15)
Anion gap: 13 (ref 5–15)
Anion gap: 13 (ref 5–15)
Anion gap: 11 (ref 5–15)
BUN: 9 mg/dL (ref 8–23)
BUN: 9 mg/dL (ref 8–23)
BUN: 8 mg/dL (ref 8–23)
BUN: 9 mg/dL (ref 8–23)
CO2: 25 mmol/L (ref 22–32)
CO2: 25 mmol/L (ref 22–32)
CO2: 25 mmol/L (ref 22–32)
CO2: 25 mmol/L (ref 22–32)
Calcium: 7.9 mg/dL — ABNORMAL LOW (ref 8.9–10.3)
Calcium: 7.8 mg/dL — ABNORMAL LOW (ref 8.9–10.3)
Calcium: 7.9 mg/dL — ABNORMAL LOW (ref 8.9–10.3)
Calcium: 8 mg/dL — ABNORMAL LOW (ref 8.9–10.3)
Chloride: 88 mmol/L — ABNORMAL LOW (ref 98–111)
Chloride: 87 mmol/L — ABNORMAL LOW (ref 98–111)
Chloride: 89 mmol/L — ABNORMAL LOW (ref 98–111)
Chloride: 91 mmol/L — ABNORMAL LOW (ref 98–111)
Creatinine, Ser: 0.73 mg/dL (ref 0.44–1.00)
Creatinine, Ser: 0.73 mg/dL (ref 0.44–1.00)
Creatinine, Ser: 0.71 mg/dL (ref 0.44–1.00)
Creatinine, Ser: 0.68 mg/dL (ref 0.44–1.00)
GFR calc Af Amer: >60 mL/min (ref >60)
GFR calc Af Amer: >60 mL/min (ref >60)
GFR calc Af Amer: >60 mL/min (ref >60)
GFR calc Af Amer: >60 mL/min (ref >60)
GFR calc non Af Amer: >60 mL/min (ref >60)
GFR calc non Af Amer: >60 mL/min (ref >60)
GFR calc non Af Amer: >60 mL/min (ref >60)
GFR calc non Af Amer: >60 mL/min (ref >60)
Glucose, Bld: 132 mg/dL — ABNORMAL HIGH (ref 70–99)
Glucose, Bld: 148 mg/dL — ABNORMAL HIGH (ref 70–99)
Glucose, Bld: 124 mg/dL — ABNORMAL HIGH (ref 70–99)
Glucose, Bld: 126 mg/dL — ABNORMAL HIGH (ref 70–99)
Potassium: 2.7 mmol/L — CL (ref 3.5–5.1)
Potassium: 2.9 mmol/L — ABNORMAL LOW (ref 3.5–5.1)
Potassium: 3.4 mmol/L — ABNORMAL LOW (ref 3.5–5.1)
Potassium: 3.4 mmol/L — ABNORMAL LOW (ref 3.5–5.1)
Sodium: 127 mmol/L — ABNORMAL LOW (ref 135–145)
Sodium: 125 mmol/L — ABNORMAL LOW (ref 135–145)
Sodium: 127 mmol/L — ABNORMAL LOW (ref 135–145)
Sodium: 127 mmol/L — ABNORMAL LOW (ref 135–145)

## 2019-09-18 LAB — PROCALCITONIN: Procalcitonin: <0.1 ng/mL

## 2019-09-18 LAB — RETICULOCYTES
Immature Retic Fract: 33.2 % — ABNORMAL HIGH (ref 2.3–15.9)
RBC.: 3.4 MIL/uL — ABNORMAL LOW (ref 3.87–5.11)
Retic Count, Absolute: 94.2 10*3/uL (ref 19.0–186.0)
Retic Ct Pct: 2.8 % (ref 0.4–3.1)

## 2019-09-18 LAB — TSH: TSH: 6.61 u[IU]/mL — ABNORMAL HIGH (ref 0.350–4.500)

## 2019-09-18 LAB — MAGNESIUM
Magnesium: 1.6 mg/dL — ABNORMAL LOW (ref 1.7–2.4)
Magnesium: 2.2 mg/dL (ref 1.7–2.4)

## 2019-09-18 LAB — GLUCOSE, CAPILLARY
Glucose-Capillary: 197 mg/dL — ABNORMAL HIGH (ref 70–99)
Glucose-Capillary: 130 mg/dL — ABNORMAL HIGH (ref 70–99)
Glucose-Capillary: 116 mg/dL — ABNORMAL HIGH (ref 70–99)

## 2019-09-18 LAB — OSMOLALITY: Osmolality: 263 mOsm/kg — ABNORMAL LOW (ref 275–295)

## 2019-09-18 LAB — URIC ACID: Uric Acid, Serum: 4.8 mg/dL (ref 2.5–7.1)

## 2019-09-18 LAB — T4, FREE: Free T4: 1.41 ng/dL — ABNORMAL HIGH (ref 0.61–1.12)

## 2019-09-18 MED ORDER — DOXYCYCLINE HYCLATE 100 MG PO TABS
100.0000 mg | ORAL_TABLET | Freq: Two times a day (BID) | ORAL | Status: DC
  Administered 2019-09-18: 100 mg via ORAL
  Filled 2019-09-18 (×2): qty 1

## 2019-09-18 MED ORDER — POTASSIUM CHLORIDE CRYS ER 20 MEQ PO TBCR
40.0000 meq | EXTENDED_RELEASE_TABLET | ORAL | Status: AC
  Administered 2019-09-18 (×2): 40 meq via ORAL
  Filled 2019-09-18 (×2): qty 2

## 2019-09-18 MED ORDER — MAGNESIUM SULFATE 2 GM/50ML IV SOLN
2.0000 g | Freq: Once | INTRAVENOUS | Status: AC
  Administered 2019-09-18: 13:00:00 2 g via INTRAVENOUS
  Filled 2019-09-18: qty 50

## 2019-09-18 MED ORDER — SODIUM CHLORIDE 0.9 % IV SOLN
INTRAVENOUS | Status: DC | PRN
  Administered 2019-09-19: 250 mL via INTRAVENOUS

## 2019-09-18 MED ORDER — POTASSIUM CHLORIDE CRYS ER 20 MEQ PO TBCR
40.0000 meq | EXTENDED_RELEASE_TABLET | Freq: Once | ORAL | Status: AC
  Administered 2019-09-18: 20:00:00 40 meq via ORAL
  Filled 2019-09-18: qty 2

## 2019-09-18 MED ORDER — SODIUM CHLORIDE 0.9 % IV SOLN
2.0000 g | Freq: Three times a day (TID) | INTRAVENOUS | Status: DC
  Administered 2019-09-18 – 2019-09-20 (×8): 2 g via INTRAVENOUS
  Filled 2019-09-18 (×9): qty 2

## 2019-09-18 MED ORDER — LEVOTHYROXINE SODIUM 75 MCG PO TABS: 75.0000 ug | ORAL_TABLET | Freq: Every day | ORAL | Status: DC

## 2019-09-18 MED ORDER — LEVOTHYROXINE SODIUM 88 MCG PO TABS
88.0000 ug | ORAL_TABLET | Freq: Every day | ORAL | Status: DC
  Administered 2019-09-19 – 2019-09-20 (×2): 88 ug via ORAL
  Filled 2019-09-18 (×2): qty 1

## 2019-09-18 MED ORDER — POTASSIUM CHLORIDE 2 MEQ/ML IV SOLN: INTRAVENOUS | Status: DC

## 2019-09-18 MED ORDER — KCL IN DEXTROSE-NACL 10-5-0.45 MEQ/L-%-% IV SOLN
INTRAVENOUS | Status: DC
  Administered 2019-09-18: 05:00:00 via INTRAVENOUS
  Filled 2019-09-18: qty 1000

## 2019-09-18 MED ORDER — POTASSIUM CHLORIDE 10 MEQ/100ML IV SOLN
10.0000 meq | INTRAVENOUS | Status: AC
  Administered 2019-09-18 (×2): 10 meq via INTRAVENOUS
  Filled 2019-09-18 (×2): qty 100

## 2019-09-18 NOTE — Progress Notes (Signed)
Orthopedics Progress Note  Subjective: Patient reports feeling a little better.  She did have a BM yesterday and urinated without burning  Objective:  Vitals:   09/17/19 2156 09/18/19 0603  BP: 124/61 111/61  Pulse: 87 81  Resp: 17 17  Temp: 99.2 F (37.3 C) 98.3 F (36.8 C)  SpO2: 97% 97%    General: Awake and alert  Musculoskeletal: Right knee dressing CDI, Neg cords and Homans. No pain with ankle pumps Neurovascularly intact  Lab Results  Component Value Date   WBC 17.7 (H) 09/17/2019   HGB 9.9 (L) 09/17/2019   HCT 30.4 (L) 09/17/2019   MCV 84.9 09/17/2019   PLT 335 09/17/2019       Component Value Date/Time   NA 127 (L) 09/18/2019 0338   NA 136 09/01/2017 1045   K 2.7 (LL) 09/18/2019 0338   K 3.7 09/01/2017 1045   CL 88 (L) 09/18/2019 0338   CO2 25 09/18/2019 0338   CO2 31 (H) 09/01/2017 1045   GLUCOSE 132 (H) 09/18/2019 0338   GLUCOSE 104 09/01/2017 1045   BUN 9 09/18/2019 0338   BUN 7.3 09/01/2017 1045   CREATININE 0.73 09/18/2019 0338   CREATININE 0.9 09/01/2017 1045   CALCIUM 7.9 (L) 09/18/2019 0338   CALCIUM 9.9 09/01/2017 1045   GFRNONAA >60 09/18/2019 0338   GFRAA >60 09/18/2019 0338    No results found for: INR, PROTIME  Assessment/Plan: POD #4 s/p Procedure(s): TOTAL KNEE ARTHROPLASTY Will continue to work on electrolyes and evaluate elevated WBC. Continue PT, OT, mobilization  [employer]. Veverly Fells, MD 09/18/2019 7:56 AM

## 2019-09-18 NOTE — Progress Notes (Signed)
Pharmacy Antibiotic Note  Kelli Sandoval is a 73 y.o. female admitted on 09/14/2019 with pneumonia.  Pharmacy has been consulted for Cefepime dosing.  MD initially ordered Doxycycline and Ceftazidime.   Plan: Cefepime 2gm q8   Height: 5\' 9"  (175.3 cm) Weight: 181 lb 6.4 oz (82.3 kg) IBW/kg (Calculated) : 66.2  Temp (24hrs), Avg:99.5 F (37.5 C), Min:98.3 F (36.8 C), Max:100.5 F (38.1 C)  Recent Labs  Lab 09/15/19 0411 09/16/19 0308 09/17/19 1234 09/18/19 0338 09/18/19 0916  WBC 12.9* 18.3* 17.7*  --  13.3*  CREATININE 0.76  --  0.82 0.73 0.73    Estimated Creatinine Clearance: 72.9 mL/min (by C-G formula based on SCr of 0.73 mg/dL).    No Known Allergies  Antimicrobials this admission: 10/13 Doxycycline po x1 10/13 Cefepime >>   Dose adjustments this admission:  Microbiology results: 10/13 BCx x1 sent   Thank you for allowing pharmacy to be a part of this patient's care.  Minda Ditto PharmD 09/18/2019 12:32 PM

## 2019-09-18 NOTE — Progress Notes (Signed)
Right lower extremity venous duplex has been completed. Preliminary results can be found in CV Proc through chart review.   09/18/19 11:40 AM Carlos Levering RVT

## 2019-09-18 NOTE — Care Management Important Message (Signed)
Important Message  Patient Details IM Letter given to Velva Harman RN to present to the Patient Name: Kelli Sandoval MRN: [id number] Date of Birth: Aug 16, 1946   Medicare Important Message Given:  Yes     Kerin Salen 09/18/2019, 11:36 AM

## 2019-09-18 NOTE — Consult Note (Signed)
[facility] Initial Consultation Note   Patient Name: Kelli Sandoval    [id number] PCP: Donald Prose, MD     DOB: 05-19-1946  DOA: 09/14/2019 DOS: the patient was seen and examined on 09/18/2019   Referring physician: Dr. Veverly Fells Reason for consult: Medical management, hyponatremia, fever, lethargy, hypokalemia  HPI: Kelli Sandoval is a 73 y.o. female with Past medical history of hypertension, hypothyroidism. Patient is coming from Home Patient presented with complaints of right knee pain.  Patient failed medical management outpatient.  And presented to undergo elective total knee arthroplasty.  Surgery happened on 09/14/2019.  Patient tolerated procedure very well.  Postprocedure patient had some fever.  Also had some leukocytosis.  Patient had some expected blood loss anemia. Patient remained more fatigue and tired. The patient had poor progression in terms of therapy. After postop day 2 patient started having hyponatremia as well as hypokalemia. At the time of my evaluation patient did not have any complaints of nausea vomiting abdominal pain chest pain shortness of breath, cough, fever, chills. He denies any dizziness or lightheadedness no focal deficit pretension patient's only complaint was right knee pain and right leg swelling.  Review of Systems: as mentioned in the history of present illness.  All other systems reviewed and are negative.  Past Medical History:  Diagnosis Date  . Allergic rhinitis   . Colorectal polyps   . Hypertension   . Hypothyroidism    Past Surgical History:  Procedure Laterality Date  . allergic rhinitis    . BREAST BIOPSY Left 2012   benign  . COLONOSCOPY  10/13/2011   Procedure: COLONOSCOPY;  Surgeon: Missy Sabins, MD;  Location: [facility];  Service: Endoscopy;  Laterality: N/A;  . LUNG SURGERY  25 years ago   benign tumor  . TOTAL KNEE ARTHROPLASTY Right 09/14/2019   Procedure: TOTAL KNEE ARTHROPLASTY;  Surgeon: Netta Cedars, MD;  Location: [facility];  Service: Orthopedics;  Laterality: Right;   Social History:  reports that she has been smoking cigarettes. She has never used smokeless tobacco. She reports that she does not drink alcohol or use drugs.  No Known Allergies   Family History  Problem Relation Age of Onset  . Breast cancer Sister     Prior to Admission medications   Medication Sig Start Date End Date Taking? Authorizing Provider  acetaminophen (TYLENOL) 650 MG CR tablet Take 650-1,300 mg by mouth every 8 (eight) hours as needed (shoulder pain/pain.).   Yes [provider]  Ascorbic Acid (VITAMIN C GUMMIES PO) Take 2 tablets by mouth daily.   Yes [provider]  cholecalciferol (VITAMIN D) 25 MCG (1000 UT) tablet Take 1,000 Units by mouth daily.   Yes [provider]  dorzolamide-timolol (COSOPT) 22.3-6.8 MG/ML ophthalmic solution Place 1 drop into both eyes 2 (two) times daily. 12/25/18  Yes [provider]  fish oil-omega-3 fatty acids 1000 MG capsule Take 1 g by mouth 2 (two) times daily.    Yes [provider]  hydrochlorothiazide (HYDRODIURIL) 25 MG tablet Take 25 mg by mouth daily. 12/07/18  Yes [provider]  latanoprost (XALATAN) 0.005 % ophthalmic solution Place 1 drop into both eyes at bedtime. 11/10/18  Yes [provider]  levothyroxine (SYNTHROID, LEVOTHROID) 88 MCG tablet Take 88 mcg by mouth daily. 01/09/19  Yes [provider]  loratadine (CLARITIN) 10 MG tablet Take 10 mg by mouth daily.   Yes [provider]  metFORMIN (GLUCOPHAGE-XR) 500 MG 24 hr tablet  Take 500 mg by mouth 3 (three) times daily after meals.   Yes [provider]  simvastatin (ZOCOR) 20 MG tablet Take 20 mg by mouth daily.  03/11/19  Yes [provider]  aspirin EC 81 MG tablet Take 1 tablet (81 mg total) by mouth 2 (two) times daily. 09/14/19   Netta Cedars, MD  methocarbamol (ROBAXIN) 500 MG tablet Take 1 tablet (500  mg total) by mouth 3 (three) times daily as needed. 09/14/19   Netta Cedars, MD  oxyCODONE-acetaminophen (PERCOCET) 5-325 MG tablet Take 1 tablet by mouth every 4 (four) hours as needed for severe pain. 09/14/19 09/13/20  Netta Cedars, MD    Physical Exam: Vitals:   09/17/19 1400 09/17/19 2156 09/18/19 0603 09/18/19 1356  BP:  124/61 111/61 121/71  Pulse:  87 81 80  Resp:  17 17 15   Temp: 99.8 F (37.7 C) 99.2 F (37.3 C) 98.3 F (36.8 C) 98.9 F (37.2 C)  TempSrc:  Oral Oral Oral  SpO2:  97% 97% 97%  Weight:      Height:       General: alert and oriented to time, place, and person. Appear in mild distress, affect appropriate Eyes: PERRL, Conjunctiva normal ENT: Oral Mucosa Clear, moist  Neck: no JVD, no Abnormal Mass Or lumps Cardiovascular: S1 and S2 Present, no Murmur, peripheral pulses symmetrical Respiratory: normal respiratory effort, Bilateral Air entry equal and Decreased, no use of accessory muscle, faint bilateral  Crackles, no wheezes Abdomen: Bowel Sound present, Soft and no tenderness, no hernia Skin: no rashes  Extremities: right leg Pedal edema, no calf tenderness Neurologic: without any new focal findings Gait not checked due to patient safety concerns   Labs:  CBC: Recent Labs  Lab 09/15/19 0411 09/16/19 0308 09/17/19 1234 09/18/19 0916  WBC 12.9* 18.3* 17.7* 13.3*  NEUTROABS  --   --   --  10.5*  HGB 10.7* 10.0* 9.9* 9.5*  HCT 33.7* 30.9* 30.4* 28.5*  MCV 86.4 85.4 84.9 83.8  PLT 353 372 [id number]   Basic Metabolic Panel: Recent Labs  Lab 09/15/19 0411 09/17/19 1234 09/18/19 0338 09/18/19 0916 09/18/19 1551  NA 132* 127* 127* 125* 127*  K 3.3* 2.8* 2.7* 2.9* 3.4*  CL 94* 87* 88* 87* 89*  CO2 25 26 25 25 25   GLUCOSE 119* 139* 132* 148* 124*  BUN 7* 13 9 9 8   CREATININE 0.76 0.82 0.73 0.73 0.71  CALCIUM 8.9 8.3* 7.9* 7.8* 7.9*  MG  --   --   --  1.6* 2.2   Liver Function Tests: No results for input(s): AST, ALT, ALKPHOS, BILITOT, PROT,  ALBUMIN in the last 168 hours. No results for input(s): LIPASE, AMYLASE in the last 168 hours. No results for input(s): AMMONIA in the last 168 hours.  Cardiac Enzymes: No results for input(s): CKTOTAL, CKMB, CKMBINDEX, TROPONINI in the last 168 hours. No results for input(s): PROBNP in the last 8760 hours.  CBG: Recent Labs  Lab 09/17/19 1135 09/17/19 1621 09/17/19 2200 09/18/19 1212 09/18/19 1623  GLUCAP 127* 128* 120* 197* 130*    Radiological Exams: Dg Chest 2 View  Result Date: 09/18/2019 CLINICAL DATA:  Hypoxia EXAM: CHEST - 2 VIEW COMPARISON:  05/13/2006 FINDINGS: Heart is borderline in size. Bibasilar atelectasis or infiltrates. No overt edema or effusions. No acute bony abnormality. Postoperative changes in the right lung. IMPRESSION: Bibasilar opacities could reflect atelectasis or infiltrates. Electronically Signed   By: Rolm Baptise M.D.   On: 09/18/2019  11:07   Vas [place] Lower Extremity Venous (dvt)  Result Date: 09/18/2019  Lower Venous Study Indications: Edema.  Risk Factors: Surgery. Limitations: Poor ultrasound/tissue interface, bandages and patient pain tolerance, patient immobility. Comparison Study: No prior studies. Performing Technologist: Oliver Hum RVT  Examination Guidelines: A complete evaluation includes B-mode imaging, spectral Doppler, color Doppler, and power Doppler as needed of all accessible portions of each vessel. Bilateral testing is considered an integral part of a complete examination. Limited examinations for reoccurring indications may be performed as noted.  +---------+---------------+---------+-----------+----------+--------------+ RIGHT    CompressibilityPhasicitySpontaneityPropertiesThrombus Aging +---------+---------------+---------+-----------+----------+--------------+ CFV      Full           Yes      Yes                                 +---------+---------------+---------+-----------+----------+--------------+ SFJ      Full                                                         +---------+---------------+---------+-----------+----------+--------------+ FV Prox  Full                                                        +---------+---------------+---------+-----------+----------+--------------+ FV Mid                  Yes      Yes                                 +---------+---------------+---------+-----------+----------+--------------+ FV Distal               Yes      Yes                                 +---------+---------------+---------+-----------+----------+--------------+ PFV      Full                                                        +---------+---------------+---------+-----------+----------+--------------+ POP                     Yes      Yes                                 +---------+---------------+---------+-----------+----------+--------------+ PTV      Full                                                        +---------+---------------+---------+-----------+----------+--------------+ PERO     Full                                                        +---------+---------------+---------+-----------+----------+--------------+   +----+---------------+---------+-----------+----------+--------------+  LEFTCompressibilityPhasicitySpontaneityPropertiesThrombus Aging +----+---------------+---------+-----------+----------+--------------+ CFV Full           Yes      Yes                                 +----+---------------+---------+-----------+----------+--------------+     Summary: Right: There is no evidence of deep vein thrombosis in the lower extremity. However, portions of this examination were limited- see technologist comments above. No cystic structure found in the popliteal fossa. Left: No evidence of common femoral vein obstruction.  *See table(s) above for measurements and observations. Electronically signed by Deitra Mayo MD on  09/18/2019 at 3:29:40 PM.    Final     EKG: Independently reviewed. normal sinus rhythm, nonspecific ST and T waves changes.  Assessment/Plan Active Problems:   Hypothyroidism   Hyperlipidemia   Iron deficiency anemia   Status post total knee replacement, right   Hyponatremia   Hypokalemia   Leucocytosis   Type 2 diabetes mellitus with hyperlipidemia ([place])    1.  Hyponatremia Hypokalemia Hypomagnesemia Hyponatremia likely hypoosmolar. Hypervolemic? We will continue free water restriction. Serum osmolality 263. Urine work-up ordered but currently pending. Changing patient's diet to regular diet. Discontinue fluids.  Discontinue HCTZ We will replace potassium aggressively. Also replacing magnesium.  2.  Leukocytosis. Bilateral basal atelectasis. Possible healthcare associated pneumonia. Patient has progressive leukocytosis as well as fever a few days ago. That is more likely atelectasis rather than any infection. Procalcitonin level is also negative. For now I will perform blood cultures and initiate the patient on antibiotics. Discontinue to biotics if the cultures are negative. De-escalate antibiotic if the patient has negative cultures and continues to have evidence of infection. Incentive spirometry.  3.  Postoperative acute blood loss anemia. Iron deficiency anemia. Baseline H&H around 12.5 on admission. Currently hemoglobin is 9.5. Stable but monitor. Continue iron supplementation.  4.  Hypothyroidism. TSH 6.61. Free T4 1.41. Continue current Synthroid.  5.  Type 2 diabetes mellitus.  Uncontrolled  Hyperglycemia. Monitor for worsening of hyperglycemia while the patient's diet is changed to regular diet. Currently sugars are well controlled. Hemoglobin A1c 7.3 in September. Holding Metformin.  6.  Essential hypertension. Discontinue HCTZ.  Family Communication: Discussed with daughter on the phone Primary team communication: Discussed with primary team   Thank you very much for involving [place] in care of your patient.  We will continue to follow the patient.   Author: Berle Mull, MD [facility] 09/18/2019 5:34 PM    If 7PM-7AM, please contact night-coverage [email or web address]

## 2019-09-18 NOTE — Progress Notes (Signed)
PT Cancellation Note  Patient Details Name: Kelli Sandoval MRN: [id number] DOB: [birth year]   Cancelled Treatment:    Reason Eval/Treat Not Completed: Attempted pm session-pt was upset about being in the chair. Spoke with nursing who requested Pt be encouraged to sit up/remain in chair. Pt refused ambulation with PT.   Weston Anna, PT Acute Rehabilitation Services Pager: [phone] Office: [phone]

## 2019-09-18 NOTE — Progress Notes (Signed)
Received a critical lab value of K: 2.7. Called [facility] to request more IV potassium. Waiting on on call physician to call back. Waiting for further orders.

## 2019-09-18 NOTE — Progress Notes (Signed)
CRITICAL VALUE ALERT  Critical Value: K+: 2.7  Date & Time Notied: 09/18/19 at 4:40 AM Kelli Sandoval called about this value)  Provider Notified: Kristen Cardinal, PA  Orders Received/Actions taken:   Administer this IV fluid: dextrose 5% and 0.45% NaCl with KCI 10 mEq/L infusion, intravenous, 10 mL/hr  Waiting on Pharmacy to tube up this fluid.

## 2019-09-18 NOTE — Progress Notes (Signed)
Physical Therapy Treatment Patient Details Name: Kelli Sandoval MRN: [id number] DOB: [birth year] Today's Date: 09/18/2019    History of Present Illness 73 yo female s/p R TKA 10/9.    PT Comments    Progressing very slowly.    Follow Up Recommendations  Follow surgeon's recommendation for DC plan and follow-up therapies;Supervision/Assistance - 24 hour(pt declines placement)     Equipment Recommendations  Rolling walker with 5" wheels;3in1 (PT)    Recommendations for Other Services       Precautions / Restrictions Precautions Precautions: Fall;Knee Required Braces or Orthoses: Knee Immobilizer - Right Knee Immobilizer - Right: On except when in CPM Restrictions Weight Bearing Restrictions: No RLE Weight Bearing: Weight bearing as tolerated    Mobility  Bed Mobility Overal bed mobility: Needs Assistance Bed Mobility: Supine to Sit;Sit to Supine     Supine to sit: Min assist;HOB elevated Sit to supine: Min assist;HOB elevated   General bed mobility comments: Assist for R LE . Cues for technique. Increased time  Transfers Overall transfer level: Needs assistance Equipment used: Rolling walker (2 wheeled) Transfers: Sit to/from Stand Sit to Stand: Min guard;From elevated surface         General transfer comment: Increased time. Close guard for safety. VCs safety, hand placement.  Ambulation/Gait Ambulation/Gait assistance: Min guard Gait Distance (Feet): 40 Feet Assistive device: Rolling walker (2 wheeled) Gait Pattern/deviations: Step-to pattern;Step-through pattern;Decreased stride length     General Gait Details: Very slow gait speed with short step lengths bilaterally. Cues for safety, technique, sequence, distance from RW, increased step lengths.   Stairs             Wheelchair Mobility    Modified Rankin (Stroke Patients Only)       Balance Overall balance assessment: Needs assistance         Standing balance support:  Bilateral upper extremity supported Standing balance-Leahy Scale: Poor                              Cognition Arousal/Alertness: Awake/alert Behavior During Therapy: WFL for tasks assessed/performed Overall Cognitive Status: Within Functional Limits for tasks assessed                                        Exercises Total Joint Exercises Ankle Circles/Pumps: AROM;Both;10 reps;Supine Quad Sets: AROM;Both;10 reps;Supine Heel Slides: AAROM;Right;10 reps;Supine Hip ABduction/ADduction: AAROM;Right;10 reps;Supine Straight Leg Raises: AAROM;Supine;10 reps;Right Goniometric ROM: ~10-45 degrees (limited by pain and pt's unwillingness to progress ROM) Marching in Standing: Right    General Comments        Pertinent Vitals/Pain Pain Assessment: 0-10 Pain Score: 6  Pain Location: R knee Pain Descriptors / Indicators: Aching;Discomfort;Sore Pain Intervention(s): Limited activity within patient's tolerance;Monitored during session;Ice applied;Repositioned    Home Living                      Prior Function            PT Goals (current goals can now be found in the care plan section) Progress towards PT goals: Progressing toward goals    Frequency    7X/week      PT Plan Current plan remains appropriate    Co-evaluation              AM-PAC PT "6 Clicks" Mobility   Outcome Measure  Help needed turning from your back to your side while in a flat bed without using bedrails?: A Little Help needed moving from lying on your back to sitting on the side of a flat bed without using bedrails?: A Little Help needed moving to and from a bed to a chair (including a wheelchair)?: A Little Help needed standing up from a chair using your arms (e.g., wheelchair or bedside chair)?: A Little Help needed to walk in hospital room?: A Little Help needed climbing 3-5 steps with a railing? : A Little 6 Click Score: 18    End of Session Equipment  Utilized During Treatment: Gait belt;Right knee immobilizer Activity Tolerance: Patient limited by fatigue Patient left: in bed;with call bell/phone within reach;with bed alarm set   PT Visit Diagnosis: Difficulty in walking, not elsewhere classified (R26.2);Other abnormalities of gait and mobility (R26.89);Pain Pain - Right/Left: Right Pain - part of body: Knee     Time: 0936-1010 PT Time Calculation (min) (ACUTE ONLY): 34 min  Charges:  $Gait Training: 8-22 mins $Therapeutic Exercise: 8-22 mins                        Weston Anna, PT Acute Rehabilitation Services Pager: [phone] Office: [phone]

## 2019-09-19 DIAGNOSIS — E785 Hyperlipidemia, unspecified: Secondary | ICD-10-CM

## 2019-09-19 DIAGNOSIS — E1169 Type 2 diabetes mellitus with other specified complication: Secondary | ICD-10-CM

## 2019-09-19 DIAGNOSIS — E039 Hypothyroidism, unspecified: Secondary | ICD-10-CM

## 2019-09-19 DIAGNOSIS — Z96651 Presence of right artificial knee joint: Secondary | ICD-10-CM

## 2019-09-19 LAB — CBC WITH DIFFERENTIAL/PLATELET
Abs Immature Granulocytes: 0.09 10*3/uL — ABNORMAL HIGH (ref 0.00–0.07)
Basophils Absolute: 0 10*3/uL (ref 0.0–0.1)
Basophils Relative: 0 %
Eosinophils Absolute: 0 10*3/uL (ref 0.0–0.5)
Eosinophils Relative: 0 %
HCT: 28.4 % — ABNORMAL LOW (ref 36.0–46.0)
Hemoglobin: 9 g/dL — ABNORMAL LOW (ref 12.0–15.0)
Immature Granulocytes: 1 %
Lymphocytes Relative: 12 %
Lymphs Abs: 1.4 10*3/uL (ref 0.7–4.0)
MCH: 27.2 pg (ref 26.0–34.0)
MCHC: 31.7 g/dL (ref 30.0–36.0)
MCV: 85.8 fL (ref 80.0–100.0)
Monocytes Absolute: 1.2 10*3/uL — ABNORMAL HIGH (ref 0.1–1.0)
Monocytes Relative: 10 %
Neutro Abs: 8.6 10*3/uL — ABNORMAL HIGH (ref 1.7–7.7)
Neutrophils Relative %: 77 %
Platelets: 384 10*3/uL (ref 150–400)
RBC: 3.31 MIL/uL — ABNORMAL LOW (ref 3.87–5.11)
RDW: 14.7 % (ref 11.5–15.5)
WBC: 11.2 10*3/uL — ABNORMAL HIGH (ref 4.0–10.5)
nRBC: 0.2 % (ref 0.0–0.2)

## 2019-09-19 LAB — URINALYSIS, COMPLETE (UACMP) WITH MICROSCOPIC
Bilirubin Urine: NEGATIVE
Glucose, UA: NEGATIVE mg/dL
Hgb urine dipstick: NEGATIVE
Ketones, ur: 5 mg/dL — AB
Leukocytes,Ua: NEGATIVE
Nitrite: NEGATIVE
Protein, ur: NEGATIVE mg/dL
Specific Gravity, Urine: 1.017 (ref 1.005–1.030)
pH: 8 (ref 5.0–8.0)

## 2019-09-19 LAB — COMPREHENSIVE METABOLIC PANEL
ALT: 22 U/L (ref 0–44)
AST: 27 U/L (ref 15–41)
Albumin: 2.8 g/dL — ABNORMAL LOW (ref 3.5–5.0)
Alkaline Phosphatase: 63 U/L (ref 38–126)
Anion gap: 11 (ref 5–15)
BUN: 9 mg/dL (ref 8–23)
CO2: 23 mmol/L (ref 22–32)
Calcium: 7.9 mg/dL — ABNORMAL LOW (ref 8.9–10.3)
Chloride: 93 mmol/L — ABNORMAL LOW (ref 98–111)
Creatinine, Ser: 0.75 mg/dL (ref 0.44–1.00)
GFR calc Af Amer: >60 mL/min (ref >60)
GFR calc non Af Amer: >60 mL/min (ref >60)
Glucose, Bld: 132 mg/dL — ABNORMAL HIGH (ref 70–99)
Potassium: 4 mmol/L (ref 3.5–5.1)
Sodium: 127 mmol/L — ABNORMAL LOW (ref 135–145)
Total Bilirubin: 1.3 mg/dL — ABNORMAL HIGH (ref 0.3–1.2)
Total Protein: 6.3 g/dL — ABNORMAL LOW (ref 6.5–8.1)

## 2019-09-19 LAB — GLUCOSE, CAPILLARY
Glucose-Capillary: 124 mg/dL — ABNORMAL HIGH (ref 70–99)
Glucose-Capillary: 120 mg/dL — ABNORMAL HIGH (ref 70–99)
Glucose-Capillary: 116 mg/dL — ABNORMAL HIGH (ref 70–99)
Glucose-Capillary: 141 mg/dL — ABNORMAL HIGH (ref 70–99)

## 2019-09-19 LAB — NA AND K (SODIUM & POTASSIUM), RAND UR
Potassium Urine: 36 mmol/L
Sodium, Ur: 127 mmol/L

## 2019-09-19 LAB — MAGNESIUM: Magnesium: 1.9 mg/dL (ref 1.7–2.4)

## 2019-09-19 LAB — CREATININE, URINE, RANDOM: Creatinine, Urine: 112.42 mg/dL

## 2019-09-19 LAB — OSMOLALITY, URINE: Osmolality, Ur: 559 mOsm/kg (ref 300–900)

## 2019-09-19 MED ORDER — SODIUM CHLORIDE 0.9 % IV SOLN
INTRAVENOUS | Status: DC
  Administered 2019-09-19: 19:00:00 via INTRAVENOUS

## 2019-09-19 NOTE — Progress Notes (Addendum)
Physical Therapy Treatment Patient Details Name: Kelli Sandoval MRN: [id number] DOB: 19-Aug-1946 Today's Date: 09/19/2019    History of Present Illness 73 yo female s/p R TKA 10/9.    PT Comments    Progressing slowly with mobility. Continue to recommend 24/7 supervision/assist.   Follow Up Recommendations  Follow surgeon's recommendation for DC plan and follow-up therapies;Supervision/Assistance - 24 hour(pt declines placement)     Equipment Recommendations  Rolling walker with 5" wheels;3in1 (PT)    Recommendations for Other Services       Precautions / Restrictions Precautions Precautions: Fall;Knee Required Braces or Orthoses: Knee Immobilizer - Right Knee Immobilizer - Right: On except when in CPM Restrictions Weight Bearing Restrictions: No RLE Weight Bearing: Weight bearing as tolerated    Mobility  Bed Mobility Overal bed mobility: Needs Assistance Bed Mobility: Sit to Supine      Sit to supine: Min assist;HOB elevated   General bed mobility comments: Assist for LE.  Transfers Overall transfer level: Needs assistance Equipment used: Rolling walker (2 wheeled) Transfers: Sit to/from Stand Sit to Stand: Min guard         General transfer comment: Increased time. Close guard for safety.  Ambulation/Gait Ambulation/Gait assistance: Min guard Gait Distance (Feet): 85 Feet Assistive device: Rolling walker (2 wheeled) Gait Pattern/deviations: Step-through pattern;Decreased stride length     General Gait Details: slow gait speed. close guard for safety. cues for distance from RW. 1 standing rest break due to pt c/o "feeling hot." She was able to continue on to room.   Stairs             Wheelchair Mobility    Modified Rankin (Stroke Patients Only)       Balance Overall balance assessment: Needs assistance         Standing balance support: Bilateral upper extremity supported Standing balance-Leahy Scale: Poor                              Cognition Arousal/Alertness: Awake/alert Behavior During Therapy: Flat affect Overall Cognitive Status: Within Functional Limits for tasks assessed                                        Exercises Total Joint Exercises Ankle Circles/Pumps: AROM;Both;10 reps;Supine Quad Sets: AROM;Both;10 reps;Supine Heel Slides: AAROM;Right;10 reps;Supine Hip ABduction/ADduction: AAROM;Right;10 reps;Supine Straight Leg Raises: AAROM;Supine;10 reps;Right Goniometric ROM: ~10-45 degrees (limited by pain)    General Comments        Pertinent Vitals/Pain Pain Assessment: Faces Pain Score: 3  Faces Pain Scale: Hurts even more Pain Location: R knee Pain Descriptors / Indicators: Aching;Discomfort;Sore Pain Intervention(s): Limited activity within patient's tolerance;Monitored during session;Repositioned    Home Living                      Prior Function            PT Goals (current goals can now be found in the care plan section) Progress towards PT goals: Progressing toward goals    Frequency    7X/week      PT Plan Current plan remains appropriate    Co-evaluation              AM-PAC PT "6 Clicks" Mobility   Outcome Measure  Help needed turning from your back to your side while in a flat  bed without using bedrails?: A Little Help needed moving from lying on your back to sitting on the side of a flat bed without using bedrails?: A Little Help needed moving to and from a bed to a chair (including a wheelchair)?: A Little Help needed standing up from a chair using your arms (e.g., wheelchair or bedside chair)?: A Little Help needed to walk in hospital room?: A Little Help needed climbing 3-5 steps with a railing? : A Little 6 Click Score: 18    End of Session Equipment Utilized During Treatment: Gait belt;Right knee immobilizer Activity Tolerance: Patient tolerated treatment well Patient left: in bed;with call bell/phone  within reach   PT Visit Diagnosis: Difficulty in walking, not elsewhere classified (R26.2);Other abnormalities of gait and mobility (R26.89);Pain Pain - Right/Left: Right Pain - part of body: Knee     Time: [id number] PT Time Calculation (min) (ACUTE ONLY): 24 min  Charges:  $Gait Training: 23-37 mins $Therapeutic Exercise: 8-22 mins                        Weston Anna, PT Acute Rehabilitation Services Pager: [phone] Office: [phone]

## 2019-09-19 NOTE — Progress Notes (Signed)
Physical Therapy Treatment Patient Details Name: Kelli Sandoval MRN: [id number] DOB: [birth year] Today's Date: 09/19/2019    History of Present Illness 73 yo female s/p R TKA 10/9.    PT Comments    First session of true progress since POD 0. Pt tolerated increased distance well. Will continue to progress activity as pt will allow/tolerate.    Follow Up Recommendations  Follow surgeon's recommendation for DC plan and follow-up therapies;Supervision/Assistance - 24 hour(pt declines placement)     Equipment Recommendations  Rolling walker with 5" wheels;3in1 (PT)    Recommendations for Other Services       Precautions / Restrictions Precautions Precautions: Fall;Knee Required Braces or Orthoses: Knee Immobilizer - Right Knee Immobilizer - Right: On except when in CPM Restrictions Weight Bearing Restrictions: No RLE Weight Bearing: Weight bearing as tolerated    Mobility  Bed Mobility Overal bed mobility: Needs Assistance Bed Mobility: Supine to Sit     Supine to sit: Min guard;HOB elevated     General bed mobility comments: increased time. close guard for safety  Transfers Overall transfer level: Needs assistance Equipment used: Rolling walker (2 wheeled) Transfers: Sit to/from Stand Sit to Stand: From elevated surface;Min guard         General transfer comment: Increased time. Close guard for safety.  Ambulation/Gait Ambulation/Gait assistance: Min guard Gait Distance (Feet): 75 Feet Assistive device: Rolling walker (2 wheeled) Gait Pattern/deviations: Step-through pattern;Decreased stride length     General Gait Details: slow gait speed. close guard for safety. cues for distance from [employer]    Modified Rankin (Stroke Patients Only)       Balance Overall balance assessment: Needs assistance         Standing balance support: Bilateral upper extremity supported Standing balance-Leahy Scale:  Poor                              Cognition Arousal/Alertness: Awake/alert Behavior During Therapy: Flat affect Overall Cognitive Status: Within Functional Limits for tasks assessed                                        Exercises Total Joint Exercises Ankle Circles/Pumps: AROM;Both;10 reps;Supine Quad Sets: AROM;Both;10 reps;Supine Heel Slides: AAROM;Right;10 reps;Supine Hip ABduction/ADduction: AAROM;Right;10 reps;Supine Straight Leg Raises: AAROM;Supine;10 reps;Right Goniometric ROM: ~10-45 degrees (limited by pain)    General Comments        Pertinent Vitals/Pain Pain Assessment: Faces Faces Pain Scale: Hurts even more Pain Location: R knee Pain Descriptors / Indicators: Aching;Discomfort;Sore Pain Intervention(s): Monitored during session    Home Living                      Prior Function            PT Goals (current goals can now be found in the care plan section) Progress towards PT goals: Progressing toward goals    Frequency    7X/week      PT Plan Current plan remains appropriate    Co-evaluation              AM-PAC PT "6 Clicks" Mobility   Outcome Measure  Help needed turning from your back to your side while in a flat bed without using bedrails?: A  Little Help needed moving from lying on your back to sitting on the side of a flat bed without using bedrails?: A Little Help needed moving to and from a bed to a chair (including a wheelchair)?: A Little Help needed standing up from a chair using your arms (e.g., wheelchair or bedside chair)?: A Little Help needed to walk in hospital room?: A Little Help needed climbing 3-5 steps with a railing? : A Little 6 Click Score: 18    End of Session Equipment Utilized During Treatment: Gait belt;Right knee immobilizer Activity Tolerance: Patient tolerated treatment well Patient left: (in bathroom with instructions to pull call light cord-made NT aware)   PT  Visit Diagnosis: Difficulty in walking, not elsewhere classified (R26.2);Other abnormalities of gait and mobility (R26.89);Pain Pain - Right/Left: Right Pain - part of body: Knee     Time: 0920-0948 PT Time Calculation (min) (ACUTE ONLY): 28 min  Charges:  $Gait Training: 8-22 mins $Therapeutic Exercise: 8-22 mins                       Weston Anna, PT Acute Rehabilitation Services Pager: [phone] Office: [phone]

## 2019-09-19 NOTE — Progress Notes (Signed)
Patient's daughter called with concerns that patient will be sent to a SNF. Daughter says she does not want her mom to go to a SNF and the patient will have full time care at home. Would like for her to have HHPT.

## 2019-09-19 NOTE — Progress Notes (Addendum)
   Subjective: 5 Days Post-Op Procedure(s) (LRB): TOTAL KNEE ARTHROPLASTY (Right)  Pt feeling fair this morning Refused therapy yesterday afternoon Denies any new symptoms or issues Still feels a little weak Pain is tolerable Patient reports pain as moderate.  Objective:   VITALS:   Vitals:   09/18/19 2028 09/19/19 0521  BP: 122/68 134/75  Pulse: 85 82  Resp: 14 16  Temp: 98.4 F (36.9 C) 99.7 F (37.6 C)  SpO2: 100% 99%    Right knee incision healing well nv intact distally No drainage or edema Guarded rom  LABS Recent Labs    09/17/19 1234 09/18/19 0916 09/19/19 0243  HGB 9.9* 9.5* 9.0*  HCT 30.4* 28.5* 28.4*  WBC 17.7* 13.3* 11.2*  PLT 335 345 384    Recent Labs    09/18/19 1551 09/18/19 1935 09/19/19 0243  NA 127* 127* 127*  K 3.4* 3.4* 4.0  BUN 8 9 9   CREATININE 0.71 0.68 0.75  GLUCOSE 124* 126* 132*     Assessment/Plan: 5 Days Post-Op Procedure(s) (LRB): TOTAL KNEE ARTHROPLASTY (Right) Appreciate medical team helping with electrolyte imbalance Continue physical therapy Encourage mobilization D/c planning for 1-2 day depending on her progress Pain management as needed     Kathrynn Speed, [place] [facility] now [employer] [facility] [address] [place], [place], [place] [address] Phone: [phone] [email or web address] Facebook  [employer]     I have seen the patient and agree with the above assessment and plan.  PT went well today. Possible D/C tomorrow.  Her WBC is better, Urine is negative, her potassium is normal but still hyponatremic.Hoping to get that corrected or at least close to normal before D/C.  I suspect atelectasis as her IS work was weak in the room with me.  Incision looks good and her ROM is good - full extension to 90 degree flexion active assist  Will see how she is doing tomorrow.  Dr Netta Cedars [facility]

## 2019-09-19 NOTE — Progress Notes (Signed)
PROGRESS NOTE    Kelli Sandoval  [id number] DOB: 10-09-1946 DOA: 09/14/2019 PCP: Donald Prose, MD    Brief Narrative:  73 year old female with past medical history of hypertension and hypothyroidism.  Patient presented with right knee pain.  He went elective total knee arthroplasty September 14, 2019.  Postprocedure patient developed fever and leukocytosis.  He had hyponatremia and hypokalemia.  Hospital medicine consulted for electrolyte management.   Assessment & Plan:   Active Problems:   Hypothyroidism   Hyperlipidemia   Iron deficiency anemia   Status post total knee replacement, right   Hyponatremia   Hypokalemia   Leucocytosis   Type 2 diabetes mellitus with hyperlipidemia (HCC)   Osteoarthritis status post total right knee replacement.  1.  Hyponatremia.  Patient seems to be hypovolemic, his p.o. intake is low due to missing her dentures, she has increased her free water intake.  Her urinary sodium is elevated along with her urinary osmolarity.  Will place patient on normal saline at 75 mill per hour, and we will follow-up kidney function morning.  2.  Postoperative acute blood loss anemia.  To annual hemoglobin and hematocrit monitoring.  No indication for PRBC transfusion.  3.  Hypertension.  Continue blood pressure monitoring, been on hydrochlorothiazide due to electrolyte disturbances.  Blood pressure today 123/73.  4.  Type II that is mellitus.  Continue glucose coverage and monitor with insulin scale.  5.  Hypothyroidism.  Continue levothyroxine.   DVT prophylaxis: enoxaparin   Code Status:  full Family Communication: no family at the bedside  Disposition Plan/ discharge barriers: pending clinical improvement   Body mass index is 26.79 kg/m. Malnutrition Type:      Malnutrition Characteristics:      Nutrition Interventions:     RN Pressure Injury Documentation:      Subjective: Patient is feeling better, continued to have lower  extremity pain.  Her p.o. intake has decreased due to missing her dentures, her free water intake has been increased.  Objective: Vitals:   09/18/19 0603 09/18/19 1356 09/18/19 2028 09/19/19 0521  BP: 111/61 121/71 122/68 134/75  Pulse: 81 80 85 82  Resp: 17 15 14 16   Temp: 98.3 F (36.8 C) 98.9 F (37.2 C) 98.4 F (36.9 C) 99.7 F (37.6 C)  TempSrc: Oral Oral Oral Oral  SpO2: 97% 97% 100% 99%  Weight:      Height:        Intake/Output Summary (Last 24 hours) at 09/19/2019 1634 Last data filed at 09/19/2019 0800 Gross per 24 hour  Intake 860.14 ml  Output 700 ml  Net 160.14 ml   Filed Weights   09/14/19 0541  Weight: 82.3 kg    Examination:   General: Not in pain or dyspnea  Neurology: Awake and alert, non focal  E ENT: no pallor, no icterus, oral mucosa dry Cardiovascular: No JVD. S1-S2 present, rhythmic, no gallops, rubs, or murmurs. No lower extremity edema. Pulmonary: vesicular breath sounds bilaterally, adequate air movement, no wheezing, rhonchi or rales. Gastrointestinal. Abdomen with no organomegaly, non tender, no rebound or guarding Skin. No rashes Musculoskeletal: no joint deformities     Data Reviewed: I have personally reviewed following labs and imaging studies  CBC: Recent Labs  Lab 09/15/19 0411 09/16/19 0308 09/17/19 1234 09/18/19 0916 09/19/19 0243  WBC 12.9* 18.3* 17.7* 13.3* 11.2*  NEUTROABS  --   --   --  10.5* 8.6*  HGB 10.7* 10.0* 9.9* 9.5* 9.0*  HCT 33.7* 30.9* 30.4* 28.5* 28.4*  MCV 86.4 85.4 84.9 83.8 85.8  PLT 353 372 335 345 0000000   Basic Metabolic Panel: Recent Labs  Lab 09/18/19 0338 09/18/19 0916 09/18/19 1551 09/18/19 1935 09/19/19 0243  NA 127* 125* 127* 127* 127*  K 2.7* 2.9* 3.4* 3.4* 4.0  CL 88* 87* 89* 91* 93*  CO2 25 25 25 25 23   GLUCOSE 132* 148* 124* 126* 132*  BUN 9 9 8 9 9   CREATININE 0.73 0.73 0.71 0.68 0.75  CALCIUM 7.9* 7.8* 7.9* 8.0* 7.9*  MG  --  1.6* 2.2  --  1.9   GFR: Estimated Creatinine  Clearance: 72.9 mL/min (by C-G formula based on SCr of 0.75 mg/dL). Liver Function Tests: Recent Labs  Lab 09/19/19 0243  AST 27  ALT 22  ALKPHOS 63  BILITOT 1.3*  PROT 6.3*  ALBUMIN 2.8*   No results for input(s): LIPASE, AMYLASE in the last 168 hours. No results for input(s): AMMONIA in the last 168 hours. Coagulation Profile: No results for input(s): INR, PROTIME in the last 168 hours. Cardiac Enzymes: No results for input(s): CKTOTAL, CKMB, CKMBINDEX, TROPONINI in the last 168 hours. BNP (last 3 results) No results for input(s): PROBNP in the last 8760 hours. HbA1C: No results for input(s): HGBA1C in the last 72 hours. CBG: Recent Labs  Lab 09/18/19 1212 09/18/19 1623 09/18/19 2027 09/19/19 0741 09/19/19 1149  GLUCAP 197* 130* 116* 124* 120*   Lipid Profile: No results for input(s): CHOL, HDL, LDLCALC, TRIG, CHOLHDL, LDLDIRECT in the last 72 hours. Thyroid Function Tests: Recent Labs    09/18/19 0916  TSH 6.610*  FREET4 1.41*   Anemia Panel: Recent Labs    09/18/19 0916  RETICCTPCT 2.8      Radiology Studies: I have reviewed all of the imaging during this hospital visit personally     Scheduled Meds: . aspirin EC  81 mg Oral BID  . cholecalciferol  1,000 Units Oral Daily  . docusate sodium  100 mg Oral BID  . dorzolamide-timolol  1 drop Both Eyes BID  . ferrous sulfate  325 mg Oral TID PC  . insulin aspart  0-15 Units Subcutaneous TID WC  . insulin aspart  0-5 Units Subcutaneous QHS  . insulin aspart  4 Units Subcutaneous TID WC  . latanoprost  1 drop Both Eyes QHS  . levothyroxine  88 mcg Oral QAC breakfast  . loratadine  10 mg Oral Daily  . omega-3 acid ethyl esters  1 g Oral BID  . simvastatin  20 mg Oral Daily   Continuous Infusions: . sodium chloride Stopped (09/19/19 0146)  . ceFEPime (MAXIPIME) IV 2 g (09/19/19 1339)  . methocarbamol (ROBAXIN) IV Stopped (09/17/19 1100)     LOS: 4 days         Gerome Apley, MD

## 2019-09-19 NOTE — Progress Notes (Signed)
Occupational Therapy Treatment Patient Details Name: Kelli Sandoval MRN: [id number] DOB: 1946/02/19 Today's Date: 09/19/2019    History of present illness 73 yo female s/p R TKA 10/9.   OT comments  Pt with increased participation this day - but  Has very flat affect   Follow Up Recommendations  Follow surgeon's recommendation for DC plan and follow-up therapies;SNF;Home health OT;Supervision/Assistance - 24 hour(may need SNF if not enough A at home. States her daughter has back problems)    Equipment Recommendations  3 in 1 bedside commode    Recommendations for Other Services      Precautions / Restrictions Precautions Precautions: Fall;Knee Required Braces or Orthoses: Knee Immobilizer - Right Knee Immobilizer - Right: On except when in CPM Restrictions Weight Bearing Restrictions: No RLE Weight Bearing: Weight bearing as tolerated       Mobility Bed Mobility Overal bed mobility: Needs Assistance Bed Mobility: Supine to Sit     Supine to sit: Min guard     General bed mobility comments: increased time. close guard for safety  Transfers Overall transfer level: Needs assistance Equipment used: Rolling walker (2 wheeled) Transfers: Sit to/from Stand Sit to Stand: From elevated surface;Min guard         General transfer comment: Increased time. Close guard for safety.    Balance Overall balance assessment: Needs assistance         Standing balance support: Bilateral upper extremity supported Standing balance-Leahy Scale: Poor                             ADL either performed or assessed with clinical judgement   ADL Overall ADL's : Needs assistance/impaired     Grooming: Standing;Min guard Grooming Details (indicate cue type and reason): at sink     Lower Body Bathing: Moderate assistance;Sit to/from stand;Cueing for compensatory techniques;Cueing for sequencing;Cueing for safety       Lower Body Dressing: Moderate  assistance;With adaptive equipment;Sit to/from stand   Toilet Transfer: Minimal assistance;Stand-pivot;Cueing for safety;BSC   Toileting- Clothing Manipulation and Hygiene: Minimal assistance;Sit to/from stand         General ADL Comments: pt with increased participation this day although pt has very flat affect     Vision Patient Visual Report: No change from baseline            Cognition Arousal/Alertness: Awake/alert Behavior During Therapy: Flat affect Overall Cognitive Status: Within Functional Limits for tasks assessed                                                     Pertinent Vitals/ Pain       Pain Assessment: Faces Pain Score: 3  Faces Pain Scale: Hurts even more Pain Location: R knee Pain Descriptors / Indicators: Aching;Discomfort;Sore Pain Intervention(s): Limited activity within patient's tolerance;Monitored during session         Frequency  Min 2X/week        Progress Toward Goals  OT Goals(current goals can now be found in the care plan section)  Progress towards OT goals: Progressing toward goals     Plan Discharge plan remains appropriate       AM-PAC OT "6 Clicks" Daily Activity     Outcome Measure   Help from another person eating meals?: None Help from  another person taking care of personal grooming?: A Little Help from another person toileting, which includes using toliet, bedpan, or urinal?: A Little Help from another person bathing (including washing, rinsing, drying)?: A Lot Help from another person to put on and taking off regular upper body clothing?: A Little Help from another person to put on and taking off regular lower body clothing?: A Lot 6 Click Score: 17    End of Session Equipment Utilized During Treatment: Gait belt;Rolling walker  OT Visit Diagnosis: Unsteadiness on feet (R26.81)   Activity Tolerance Patient tolerated treatment well   Patient Left with call bell/phone within reach;in  chair   Nurse Communication Mobility status(ok therapy)        [id number] OT Time Calculation (min): 17 min  Charges: OT General Charges $OT Visit: 1 Visit OT Treatments $Self Care/Home Management : 8-22 mins  Kari Baars, OT Acute Rehabilitation Services [phone] Office- [phone]      Terri Malerba, Edwena Felty D 09/19/2019, 2:45 PM

## 2019-09-20 LAB — BASIC METABOLIC PANEL
Anion gap: 11 (ref 5–15)
BUN: 9 mg/dL (ref 8–23)
CO2: 22 mmol/L (ref 22–32)
Calcium: 8 mg/dL — ABNORMAL LOW (ref 8.9–10.3)
Chloride: 95 mmol/L — ABNORMAL LOW (ref 98–111)
Creatinine, Ser: 0.66 mg/dL (ref 0.44–1.00)
GFR calc Af Amer: >60 mL/min (ref >60)
GFR calc non Af Amer: >60 mL/min (ref >60)
Glucose, Bld: 111 mg/dL — ABNORMAL HIGH (ref 70–99)
Potassium: 3.7 mmol/L (ref 3.5–5.1)
Sodium: 128 mmol/L — ABNORMAL LOW (ref 135–145)

## 2019-09-20 LAB — GLUCOSE, CAPILLARY
Glucose-Capillary: 135 mg/dL — ABNORMAL HIGH (ref 70–99)
Glucose-Capillary: 151 mg/dL — ABNORMAL HIGH (ref 70–99)

## 2019-09-20 NOTE — Plan of Care (Signed)
  Problem: Education: Goal: Knowledge of General Education information will improve Description: Including pain rating scale, medication(s)/side effects and non-pharmacologic comfort measures Outcome: Progressing   Problem: Clinical Measurements: Goal: Will remain free from infection Outcome: Progressing   Problem: Clinical Measurements: Goal: Diagnostic test results will improve Outcome: Progressing   Problem: Clinical Measurements: Goal: Cardiovascular complication will be avoided Outcome: Progressing   Problem: Clinical Measurements: Goal: Postoperative complications will be avoided or minimized Outcome: Progressing

## 2019-09-20 NOTE — Discharge Summary (Signed)
Orthopedic Discharge Summary        Physician Discharge Summary  Patient ID: Kelli Sandoval MRN: [id number] DOB/AGE: 72-Aug-1947 72 y.o.  Admit date: 09/14/2019 Discharge date: 09/20/2019   Procedures:  Procedure(s) (LRB): TOTAL KNEE ARTHROPLASTY (Right)  Attending Physician:  Dr. Esmond Plants  Admission Diagnoses:   Right knee end stage osteoarthritis  Discharge Diagnoses:  Right knee end stage osteoarthritis   Past Medical History:  Diagnosis Date  . Allergic rhinitis   . Colorectal polyps   . Hypertension   . Hypothyroidism     PCP: Donald Prose, MD   Discharged Condition: fair  Hospital Course:  Patient underwent the above stated procedure on 09/14/2019. Patient tolerated the procedure well and brought to the recovery room in good condition and subsequently to the floor. Patient developed hyponatremia due to hypovolemia and not having her dentures. Appreciate medial consult and treatment. Pt continued with therapy and progressed to be ready for d/c.   Disposition: Discharge disposition: 01-Home or Self Care      with follow up in 2 weeks   Follow-up Information    Netta Cedars, MD. Call in 2 weeks.   Specialty: Orthopedic Surgery Why: [phone] Contact information: [address] [place] [address] [phone]           Discharge Instructions    Call MD / Call 911   Complete by: As directed    If you experience chest pain or shortness of breath, CALL 911 and be transported to the hospital emergency room.  If you develope a fever above 101 F, pus (white drainage) or increased drainage or redness at the wound, or calf pain, call your surgeon's office.   Constipation Prevention   Complete by: As directed    Drink plenty of fluids.  Prune juice may be helpful.  You may use a stool softener, such as Colace (over the counter) 100 mg twice a day.  Use MiraLax (over the counter) for constipation as needed.   Diet - low sodium  heart healthy   Complete by: As directed    Increase activity slowly as tolerated   Complete by: As directed       Allergies as of 09/20/2019   No Known Allergies     Medication List    TAKE these medications   acetaminophen 650 MG CR tablet Commonly known as: TYLENOL Take 650-1,300 mg by mouth every 8 (eight) hours as needed (shoulder pain/pain.).   aspirin EC 81 MG tablet Take 1 tablet (81 mg total) by mouth 2 (two) times daily. What changed: when to take this   cholecalciferol 25 MCG (1000 UT) tablet Commonly known as: VITAMIN D Take 1,000 Units by mouth daily.   dorzolamide-timolol 22.3-6.8 MG/ML ophthalmic solution Commonly known as: COSOPT Place 1 drop into both eyes 2 (two) times daily.   fish oil-omega-3 fatty acids 1000 MG capsule Take 1 g by mouth 2 (two) times daily.   hydrochlorothiazide 25 MG tablet Commonly known as: HYDRODIURIL Take 25 mg by mouth daily.   latanoprost 0.005 % ophthalmic solution Commonly known as: XALATAN Place 1 drop into both eyes at bedtime.   levothyroxine 88 MCG tablet Commonly known as: SYNTHROID Take 88 mcg by mouth daily.   loratadine 10 MG tablet Commonly known as: CLARITIN Take 10 mg by mouth daily.   metFORMIN 500 MG 24 hr tablet Commonly known as: GLUCOPHAGE-XR Take 500 mg by mouth 3 (three) times daily after meals.   methocarbamol 500  MG tablet Commonly known as: Robaxin Take 1 tablet (500 mg total) by mouth 3 (three) times daily as needed.   oxyCODONE-acetaminophen 5-325 MG tablet Commonly known as: Percocet Take 1 tablet by mouth every 4 (four) hours as needed for severe pain.   simvastatin 20 MG tablet Commonly known as: ZOCOR Take 20 mg by mouth daily.   VITAMIN C GUMMIES PO Take 2 tablets by mouth daily.         Signed: Ventura Bruns 09/20/2019, 9:11 AM  [facility] is now [employer] [address], [place], [place], [place] [address] Phone: [place]

## 2019-09-20 NOTE — Progress Notes (Signed)
   Subjective: 6 Days Post-Op Procedure(s) (LRB): TOTAL KNEE ARTHROPLASTY (Right)  Pt feeling better today Labs are slowly improving Therapy going fairly well Pt would like to go home Patient reports pain as moderate.  Objective:   VITALS:   Vitals:   09/19/19 2206 09/20/19 0512  BP: 116/70 130/75  Pulse: 80 73  Resp: 16 16  Temp: 99 F (37.2 C) 99.2 F (37.3 C)  SpO2: 97% 96%    Right knee incision healing well nv intact distally No rashes or edema distally  LABS Recent Labs    09/17/19 1234 09/18/19 0916 09/19/19 0243  HGB 9.9* 9.5* 9.0*  HCT 30.4* 28.5* 28.4*  WBC 17.7* 13.3* 11.2*  PLT 335 345 384    Recent Labs    09/18/19 1935 09/19/19 0243 09/20/19 0239  NA 127* 127* 128*  K 3.4* 4.0 3.7  BUN 9 9 9   CREATININE 0.68 0.75 0.66  GLUCOSE 126* 132* 111*     Assessment/Plan: 6 Days Post-Op Procedure(s) (LRB): TOTAL KNEE ARTHROPLASTY (Right) D/c home today after therapy F/u in 2 weeks Pain management as needed Pt in agreement    [facility], [place] is now [employer] [address], [place], [place] [address] Phone: [phone] [email or web address]  [employer]

## 2019-09-20 NOTE — Progress Notes (Signed)
Physical Therapy Treatment Patient Details Name: Kelli Sandoval MRN: [id number] DOB: 1946/10/12 Today's Date: 09/20/2019    History of Present Illness 73 yo female s/p R TKA 10/9.    PT Comments    POD # 6 am sessiopn Assisted OOB to amb to bathroom.  General bed mobility comments: Assist for LE and increased time to complete "give me time"  General transfer comment: 25% VC';s on proper hand placement to avoid pulling up on walker and safety with turn targeting/completion during toilet transfer. General Gait Details: slow gait speed. close guard for safety. 25% VC's on proper walker to self distance and safety with turns.   Follow Up Recommendations  Follow surgeon's recommendation for DC plan and follow-up therapies;Supervision/Assistance - 24 hour     Equipment Recommendations  Rolling walker with 5" wheels;3in1 (PT)    Recommendations for Other Services       Precautions / Restrictions Precautions Precautions: Fall;Knee Required Braces or Orthoses: Knee Immobilizer - Right Knee Immobilizer - Right: Other (comment)("KI on in bed "x when in CPM) Restrictions Weight Bearing Restrictions: No RLE Weight Bearing: Weight bearing as tolerated    Mobility  Bed Mobility Overal bed mobility: Needs Assistance Bed Mobility: Supine to Sit           General bed mobility comments: Assist for LE and increased time to complete "give me time"  Transfers Overall transfer level: Needs assistance Equipment used: Rolling walker (2 wheeled) Transfers: Sit to/from Stand Sit to Stand: Min guard;Supervision         General transfer comment: 25% VC';s on proper hand placement to avoid pulling up on walker and safety with turn targeting/completion during toilet transfer.  Ambulation/Gait Ambulation/Gait assistance: Min guard;Min assist Gait Distance (Feet): 78 Feet Assistive device: Rolling walker (2 wheeled) Gait Pattern/deviations: Step-through pattern;Decreased stride  length;Decreased stance time - right Gait velocity: decreased x 2   General Gait Details: slow gait speed. close guard for safety. 25% VC's on proper walker to self distance and safety with turns.   Stairs             Wheelchair Mobility    Modified Rankin (Stroke Patients Only)       Balance                                            Cognition Arousal/Alertness: Awake/alert Behavior During Therapy: WFL for tasks assessed/performed Overall Cognitive Status: Within Functional Limits for tasks assessed                                 General Comments: following all directions with slight delay in verbal responce      Exercises      General Comments        Pertinent Vitals/Pain Pain Assessment: 0-10 Pain Score: 7  Pain Location: R knee Pain Descriptors / Indicators: Aching;Discomfort;Sore Pain Intervention(s): Monitored during session;Patient requesting pain meds-RN notified;Repositioned;Ice applied    Home Living                      Prior Function            PT Goals (current goals can now be found in the care plan section) Progress towards PT goals: Progressing toward goals    Frequency    7X/week  PT Plan Current plan remains appropriate    Co-evaluation              AM-PAC PT "6 Clicks" Mobility   Outcome Measure  Help needed turning from your back to your side while in a flat bed without using bedrails?: A Little Help needed moving from lying on your back to sitting on the side of a flat bed without using bedrails?: A Little Help needed moving to and from a bed to a chair (including a wheelchair)?: A Little Help needed standing up from a chair using your arms (e.g., wheelchair or bedside chair)?: A Little Help needed to walk in hospital room?: A Little Help needed climbing 3-5 steps with a railing? : A Little 6 Click Score: 18    End of Session Equipment Utilized During Treatment:  Gait belt Activity Tolerance: Patient tolerated treatment well Patient left: in chair;with call bell/phone within reach Nurse Communication: Mobility status PT Visit Diagnosis: Difficulty in walking, not elsewhere classified (R26.2);Other abnormalities of gait and mobility (R26.89);Pain Pain - Right/Left: Right Pain - part of body: Knee     Time: [id number] PT Time Calculation (min) (ACUTE ONLY): 27 min  Charges:  $Gait Training: 8-22 mins $Therapeutic Activity: 8-22 mins                     Rica Koyanagi  PTA Acute  Rehabilitation Services Pager      [phone] Office      [phone]

## 2019-09-20 NOTE — Progress Notes (Signed)
Physical Therapy Treatment Patient Details Name: Kelli Sandoval MRN: [id number] DOB: [birth year] Today's Date: 09/20/2019    History of Present Illness 73 yo female s/p R TKA 10/9.    PT Comments    POD # 6 pm session.  Assisted OOB to amb to bathroom.  General bed mobility comments: assisted OOB and back to bed due to c/o fatigue  General transfer comment: 25% VC';s on proper hand placement to avoid pulling up on walker and safety with turn targeting/completion during toilet transfer. General Gait Details: slow gait speed. close guard for safety. 25% VC's on proper walker to self distance and safety with turns.  decreased amb distance due to stair training.  General stair comments: 25% VC's on proper sequerncing and safety.  Mod c/o fatigue after required one seated rest break. Then returned to room to perform some TE's following HEP handout.  Instructed on proper tech, freq as well as use of ICE.   Addressed all mobility questions, discussed appropriate activity, educated on use of ICE.  Pt ready for D/C to home.   Follow Up Recommendations  Follow surgeon's recommendation for DC plan and follow-up therapies;Supervision/Assistance - 24 hour     Equipment Recommendations  Rolling walker with 5" wheels;3in1 (PT)    Recommendations for Other Services       Precautions / Restrictions Precautions Precautions: Fall;Knee Required Braces or Orthoses: Knee Immobilizer - Right Knee Immobilizer - Right: Other (comment)("KI on in bed "x when in CPM) Restrictions Weight Bearing Restrictions: No RLE Weight Bearing: Weight bearing as tolerated    Mobility  Bed Mobility Overal bed mobility: Needs Assistance Bed Mobility: Supine to Sit           General bed mobility comments: assisted OOB and back to bed due to c/o fatigue  Transfers Overall transfer level: Needs assistance Equipment used: Rolling walker (2 wheeled) Transfers: Sit to/from Stand Sit to Stand: Min  guard;Supervision         General transfer comment: 25% VC';s on proper hand placement to avoid pulling up on walker and safety with turn targeting/completion during toilet transfer.  Ambulation/Gait Ambulation/Gait assistance: Min guard;Min assist Gait Distance (Feet): 26 Feet Assistive device: Rolling walker (2 wheeled) Gait Pattern/deviations: Step-through pattern;Decreased stride length;Decreased stance time - right Gait velocity: decreased x 2   General Gait Details: slow gait speed. close guard for safety. 25% VC's on proper walker to self distance and safety with turns.  decreased amb distance due to stair training.   Stairs Stairs: Yes Stairs assistance: Min guard Stair Management: Two rails;Step to pattern;Forwards Number of Stairs: 2 General stair comments: 25% VC's on proper sequerncing and safety.  Mod c/o fatigue after required one seated rest break.   Wheelchair Mobility    Modified Rankin (Stroke Patients Only)       Balance                                            Cognition Arousal/Alertness: Awake/alert Behavior During Therapy: WFL for tasks assessed/performed Overall Cognitive Status: Within Functional Limits for tasks assessed                                 General Comments: following all directions with slight delay in verbal responce      Exercises   Total Knee  Replacement TE's 10 reps B LE ankle pumps 10 reps towel squeezes 5 reps knee presses 5 reps heel slides  5 reps SLR's 5 reps ABD Followed by ICE     General Comments        Pertinent Vitals/Pain Pain Assessment: 0-10 Pain Score: 7  Pain Location: R knee Pain Descriptors / Indicators: Aching;Discomfort;Sore Pain Intervention(s): Monitored during session;Patient requesting pain meds-RN notified;Repositioned;Ice applied    Home Living                      Prior Function            PT Goals (current goals can now be found in  the care plan section) Progress towards PT goals: Progressing toward goals    Frequency    7X/week      PT Plan Current plan remains appropriate    Co-evaluation              AM-PAC PT "6 Clicks" Mobility   Outcome Measure  Help needed turning from your back to your side while in a flat bed without using bedrails?: A Little Help needed moving from lying on your back to sitting on the side of a flat bed without using bedrails?: A Little Help needed moving to and from a bed to a chair (including a wheelchair)?: A Little Help needed standing up from a chair using your arms (e.g., wheelchair or bedside chair)?: A Little Help needed to walk in hospital room?: A Little Help needed climbing 3-5 steps with a railing? : A Little 6 Click Score: 18    End of Session Equipment Utilized During Treatment: Gait belt Activity Tolerance: Patient tolerated treatment well Patient left: in bed Nurse Communication: Mobility status PT Visit Diagnosis: Difficulty in walking, not elsewhere classified (R26.2);Other abnormalities of gait and mobility (R26.89);Pain Pain - Right/Left: Right Pain - part of body: Knee     Time: [id number] PT Time Calculation (min) (ACUTE ONLY): 24 min  Charges:  $Gait Training: 8-22 mins $Therapeutic Exercise: 8-22 mins                     Rica Koyanagi  PTA Acute  Rehabilitation Services Pager      [phone] Office      [phone]

## 2019-09-20 NOTE — Progress Notes (Signed)
Patient is feeling well this morning, her sodium is up to 128, now she has her dentures and is tolerating p.o. well.  She was advised to avoid free water.  Patient is medically stable for discharge.

## 2019-09-23 LAB — CULTURE, BLOOD (ROUTINE X 2)
Culture: NO GROWTH
Culture: NO GROWTH
Special Requests: ADEQUATE
Special Requests: ADEQUATE

## 2019-09-26 ENCOUNTER — Ambulatory Visit: Payer: Medicare Other | Admitting: Cardiology

## 2020-03-21 ENCOUNTER — Other Ambulatory Visit: Payer: Self-pay | Admitting: Family Medicine

## 2020-03-21 DIAGNOSIS — Z1231 Encounter for screening mammogram for malignant neoplasm of breast: Secondary | ICD-10-CM

## 2020-05-01 ENCOUNTER — Ambulatory Visit
Admission: RE | Admit: 2020-05-01 | Discharge: 2020-05-01 | Disposition: A | Discharge status: Home / Self Care | Payer: Medicare Other | Source: Ambulatory Visit | Attending: Family Medicine | Admitting: Family Medicine

## 2020-05-01 ENCOUNTER — Ambulatory Visit: Payer: Medicare Other

## 2020-05-01 ENCOUNTER — Other Ambulatory Visit: Payer: Self-pay

## 2020-05-01 DIAGNOSIS — Z1231 Encounter for screening mammogram for malignant neoplasm of breast: Secondary | ICD-10-CM

## 2020-12-29 DIAGNOSIS — E1169 Type 2 diabetes mellitus with other specified complication: Secondary | ICD-10-CM | POA: Diagnosis not present

## 2020-12-29 DIAGNOSIS — D509 Iron deficiency anemia, unspecified: Secondary | ICD-10-CM | POA: Diagnosis not present

## 2020-12-29 DIAGNOSIS — I10 Essential (primary) hypertension: Secondary | ICD-10-CM | POA: Diagnosis not present

## 2020-12-29 DIAGNOSIS — E7800 Pure hypercholesterolemia, unspecified: Secondary | ICD-10-CM | POA: Diagnosis not present

## 2020-12-29 DIAGNOSIS — J449 Chronic obstructive pulmonary disease, unspecified: Secondary | ICD-10-CM | POA: Diagnosis not present

## 2020-12-29 DIAGNOSIS — E039 Hypothyroidism, unspecified: Secondary | ICD-10-CM | POA: Diagnosis not present

## 2020-12-29 DIAGNOSIS — M1711 Unilateral primary osteoarthritis, right knee: Secondary | ICD-10-CM | POA: Diagnosis not present

## 2020-12-29 DIAGNOSIS — M170 Bilateral primary osteoarthritis of knee: Secondary | ICD-10-CM | POA: Diagnosis not present

## 2020-12-29 DIAGNOSIS — E119 Type 2 diabetes mellitus without complications: Secondary | ICD-10-CM | POA: Diagnosis not present

## 2021-01-22 DIAGNOSIS — L720 Epidermal cyst: Secondary | ICD-10-CM | POA: Diagnosis not present

## 2021-01-29 DIAGNOSIS — M25561 Pain in right knee: Secondary | ICD-10-CM | POA: Diagnosis not present

## 2021-02-09 DIAGNOSIS — I10 Essential (primary) hypertension: Secondary | ICD-10-CM | POA: Diagnosis not present

## 2021-02-09 DIAGNOSIS — E7800 Pure hypercholesterolemia, unspecified: Secondary | ICD-10-CM | POA: Diagnosis not present

## 2021-02-09 DIAGNOSIS — E1169 Type 2 diabetes mellitus with other specified complication: Secondary | ICD-10-CM | POA: Diagnosis not present

## 2021-02-09 DIAGNOSIS — E039 Hypothyroidism, unspecified: Secondary | ICD-10-CM | POA: Diagnosis not present

## 2021-02-09 DIAGNOSIS — J309 Allergic rhinitis, unspecified: Secondary | ICD-10-CM | POA: Diagnosis not present

## 2021-02-09 DIAGNOSIS — F17210 Nicotine dependence, cigarettes, uncomplicated: Secondary | ICD-10-CM | POA: Diagnosis not present

## 2021-02-09 DIAGNOSIS — J449 Chronic obstructive pulmonary disease, unspecified: Secondary | ICD-10-CM | POA: Diagnosis not present

## 2021-02-09 DIAGNOSIS — Z7984 Long term (current) use of oral hypoglycemic drugs: Secondary | ICD-10-CM | POA: Diagnosis not present

## 2021-03-03 DIAGNOSIS — H26492 Other secondary cataract, left eye: Secondary | ICD-10-CM | POA: Diagnosis not present

## 2021-03-03 DIAGNOSIS — H401113 Primary open-angle glaucoma, right eye, severe stage: Secondary | ICD-10-CM | POA: Diagnosis not present

## 2021-03-03 DIAGNOSIS — E119 Type 2 diabetes mellitus without complications: Secondary | ICD-10-CM | POA: Diagnosis not present

## 2021-03-03 DIAGNOSIS — Z961 Presence of intraocular lens: Secondary | ICD-10-CM | POA: Diagnosis not present

## 2021-03-03 DIAGNOSIS — H401121 Primary open-angle glaucoma, left eye, mild stage: Secondary | ICD-10-CM | POA: Diagnosis not present

## 2021-03-03 DIAGNOSIS — H04123 Dry eye syndrome of bilateral lacrimal glands: Secondary | ICD-10-CM | POA: Diagnosis not present

## 2021-03-17 DIAGNOSIS — E119 Type 2 diabetes mellitus without complications: Secondary | ICD-10-CM | POA: Diagnosis not present

## 2021-03-17 DIAGNOSIS — E039 Hypothyroidism, unspecified: Secondary | ICD-10-CM | POA: Diagnosis not present

## 2021-03-17 DIAGNOSIS — M1711 Unilateral primary osteoarthritis, right knee: Secondary | ICD-10-CM | POA: Diagnosis not present

## 2021-03-17 DIAGNOSIS — E7800 Pure hypercholesterolemia, unspecified: Secondary | ICD-10-CM | POA: Diagnosis not present

## 2021-03-17 DIAGNOSIS — M170 Bilateral primary osteoarthritis of knee: Secondary | ICD-10-CM | POA: Diagnosis not present

## 2021-03-17 DIAGNOSIS — D509 Iron deficiency anemia, unspecified: Secondary | ICD-10-CM | POA: Diagnosis not present

## 2021-03-17 DIAGNOSIS — J449 Chronic obstructive pulmonary disease, unspecified: Secondary | ICD-10-CM | POA: Diagnosis not present

## 2021-03-17 DIAGNOSIS — I10 Essential (primary) hypertension: Secondary | ICD-10-CM | POA: Diagnosis not present

## 2021-03-17 DIAGNOSIS — E1169 Type 2 diabetes mellitus with other specified complication: Secondary | ICD-10-CM | POA: Diagnosis not present

## 2021-04-09 DIAGNOSIS — Z20822 Contact with and (suspected) exposure to covid-19: Secondary | ICD-10-CM | POA: Diagnosis not present

## 2021-04-14 ENCOUNTER — Other Ambulatory Visit: Payer: Self-pay | Admitting: Family Medicine

## 2021-04-14 DIAGNOSIS — Z1231 Encounter for screening mammogram for malignant neoplasm of breast: Secondary | ICD-10-CM

## 2021-05-14 DIAGNOSIS — E039 Hypothyroidism, unspecified: Secondary | ICD-10-CM | POA: Diagnosis not present

## 2021-05-14 DIAGNOSIS — E119 Type 2 diabetes mellitus without complications: Secondary | ICD-10-CM | POA: Diagnosis not present

## 2021-05-14 DIAGNOSIS — I10 Essential (primary) hypertension: Secondary | ICD-10-CM | POA: Diagnosis not present

## 2021-05-14 DIAGNOSIS — E7800 Pure hypercholesterolemia, unspecified: Secondary | ICD-10-CM | POA: Diagnosis not present

## 2021-05-14 DIAGNOSIS — D509 Iron deficiency anemia, unspecified: Secondary | ICD-10-CM | POA: Diagnosis not present

## 2021-05-14 DIAGNOSIS — M1711 Unilateral primary osteoarthritis, right knee: Secondary | ICD-10-CM | POA: Diagnosis not present

## 2021-05-14 DIAGNOSIS — J449 Chronic obstructive pulmonary disease, unspecified: Secondary | ICD-10-CM | POA: Diagnosis not present

## 2021-05-14 DIAGNOSIS — E1169 Type 2 diabetes mellitus with other specified complication: Secondary | ICD-10-CM | POA: Diagnosis not present

## 2021-05-14 DIAGNOSIS — M170 Bilateral primary osteoarthritis of knee: Secondary | ICD-10-CM | POA: Diagnosis not present

## 2021-06-09 ENCOUNTER — Encounter: Payer: Self-pay | Admitting: Hematology

## 2021-06-09 ENCOUNTER — Other Ambulatory Visit: Payer: Self-pay

## 2021-06-09 ENCOUNTER — Ambulatory Visit
Admission: RE | Admit: 2021-06-09 | Discharge: 2021-06-09 | Disposition: A | Discharge status: Home / Self Care | Payer: Medicare Other | Source: Ambulatory Visit | Attending: Family Medicine | Admitting: Family Medicine

## 2021-06-09 DIAGNOSIS — Z1231 Encounter for screening mammogram for malignant neoplasm of breast: Secondary | ICD-10-CM

## 2021-07-27 DIAGNOSIS — E7800 Pure hypercholesterolemia, unspecified: Secondary | ICD-10-CM | POA: Diagnosis not present

## 2021-07-27 DIAGNOSIS — I10 Essential (primary) hypertension: Secondary | ICD-10-CM | POA: Diagnosis not present

## 2021-07-27 DIAGNOSIS — E1169 Type 2 diabetes mellitus with other specified complication: Secondary | ICD-10-CM | POA: Diagnosis not present

## 2021-07-27 DIAGNOSIS — E119 Type 2 diabetes mellitus without complications: Secondary | ICD-10-CM | POA: Diagnosis not present

## 2021-07-27 DIAGNOSIS — M170 Bilateral primary osteoarthritis of knee: Secondary | ICD-10-CM | POA: Diagnosis not present

## 2021-07-27 DIAGNOSIS — J449 Chronic obstructive pulmonary disease, unspecified: Secondary | ICD-10-CM | POA: Diagnosis not present

## 2021-07-27 DIAGNOSIS — M1711 Unilateral primary osteoarthritis, right knee: Secondary | ICD-10-CM | POA: Diagnosis not present

## 2021-07-27 DIAGNOSIS — D509 Iron deficiency anemia, unspecified: Secondary | ICD-10-CM | POA: Diagnosis not present

## 2021-07-27 DIAGNOSIS — E039 Hypothyroidism, unspecified: Secondary | ICD-10-CM | POA: Diagnosis not present

## 2021-09-01 DIAGNOSIS — H401121 Primary open-angle glaucoma, left eye, mild stage: Secondary | ICD-10-CM | POA: Diagnosis not present

## 2021-09-01 DIAGNOSIS — Z0000 Encounter for general adult medical examination without abnormal findings: Secondary | ICD-10-CM | POA: Diagnosis not present

## 2021-09-01 DIAGNOSIS — Z1389 Encounter for screening for other disorder: Secondary | ICD-10-CM | POA: Diagnosis not present

## 2021-09-01 DIAGNOSIS — M8588 Other specified disorders of bone density and structure, other site: Secondary | ICD-10-CM | POA: Diagnosis not present

## 2021-09-01 DIAGNOSIS — Z7984 Long term (current) use of oral hypoglycemic drugs: Secondary | ICD-10-CM | POA: Diagnosis not present

## 2021-09-01 DIAGNOSIS — E039 Hypothyroidism, unspecified: Secondary | ICD-10-CM | POA: Diagnosis not present

## 2021-09-01 DIAGNOSIS — E7800 Pure hypercholesterolemia, unspecified: Secondary | ICD-10-CM | POA: Diagnosis not present

## 2021-09-01 DIAGNOSIS — I10 Essential (primary) hypertension: Secondary | ICD-10-CM | POA: Diagnosis not present

## 2021-09-01 DIAGNOSIS — Z23 Encounter for immunization: Secondary | ICD-10-CM | POA: Diagnosis not present

## 2021-09-01 DIAGNOSIS — E1169 Type 2 diabetes mellitus with other specified complication: Secondary | ICD-10-CM | POA: Diagnosis not present

## 2021-09-01 DIAGNOSIS — F17210 Nicotine dependence, cigarettes, uncomplicated: Secondary | ICD-10-CM | POA: Diagnosis not present

## 2021-09-01 DIAGNOSIS — J449 Chronic obstructive pulmonary disease, unspecified: Secondary | ICD-10-CM | POA: Diagnosis not present

## 2021-09-06 IMAGING — MG MM DIGITAL SCREENING BILAT W/ TOMO AND CAD
8 series · 8 of 24 positions shown · non-contrast
Comparison: Previous exam(s).

CLINICAL DATA: Screening.

EXAM:
DIGITAL SCREENING BILATERAL MAMMOGRAM WITH TOMOSYNTHESIS AND CAD
TECHNIQUE: Bilateral screening digital craniocaudal and mediolateral oblique
mammograms were obtained. Bilateral screening digital breast
tomosynthesis was performed. The images were evaluated with
computer-aided detection.

[R MLO synth-2D]
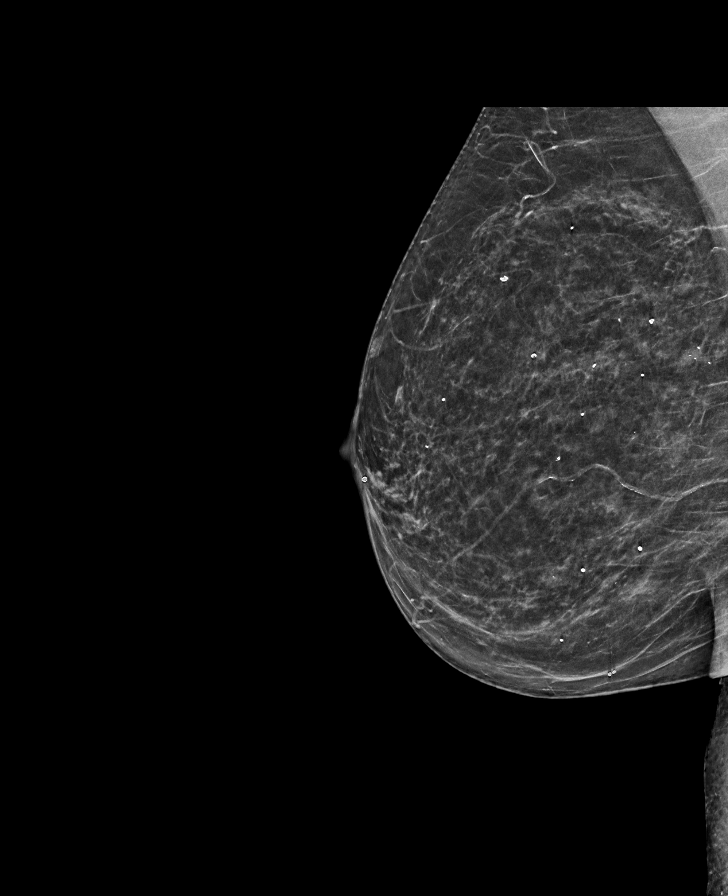

[R CC synth-2D]
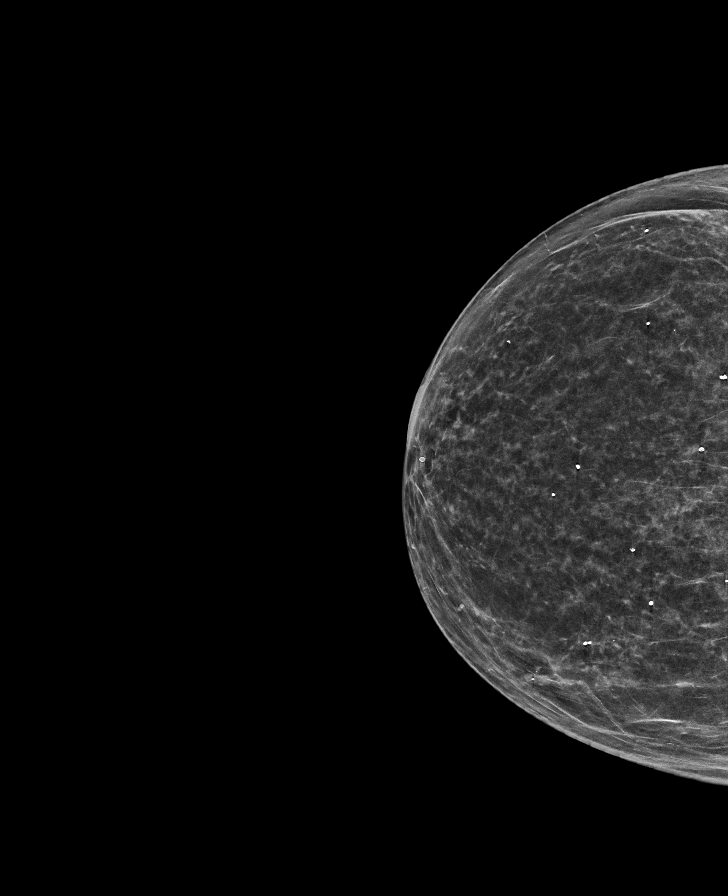

[L CC synth-2D]
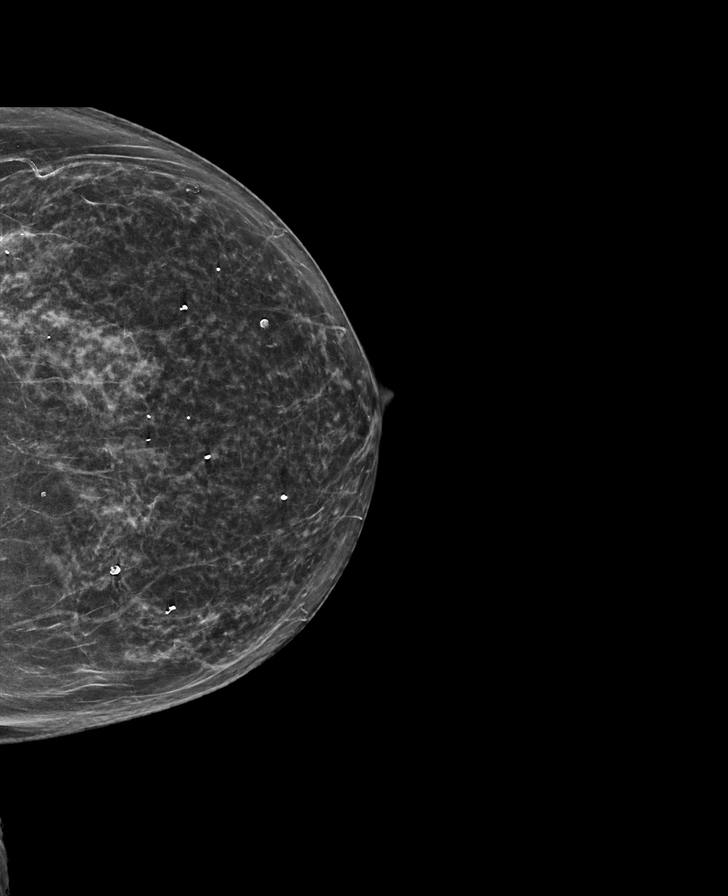

[L MLO synth-2D]
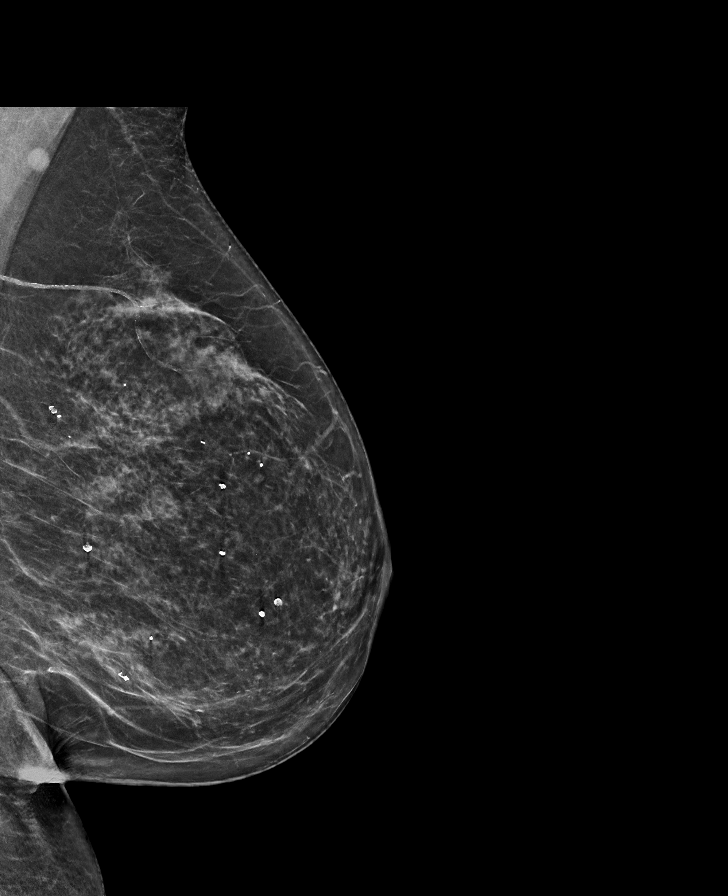

[R MLO tomo · tomo slice 33/64.0]
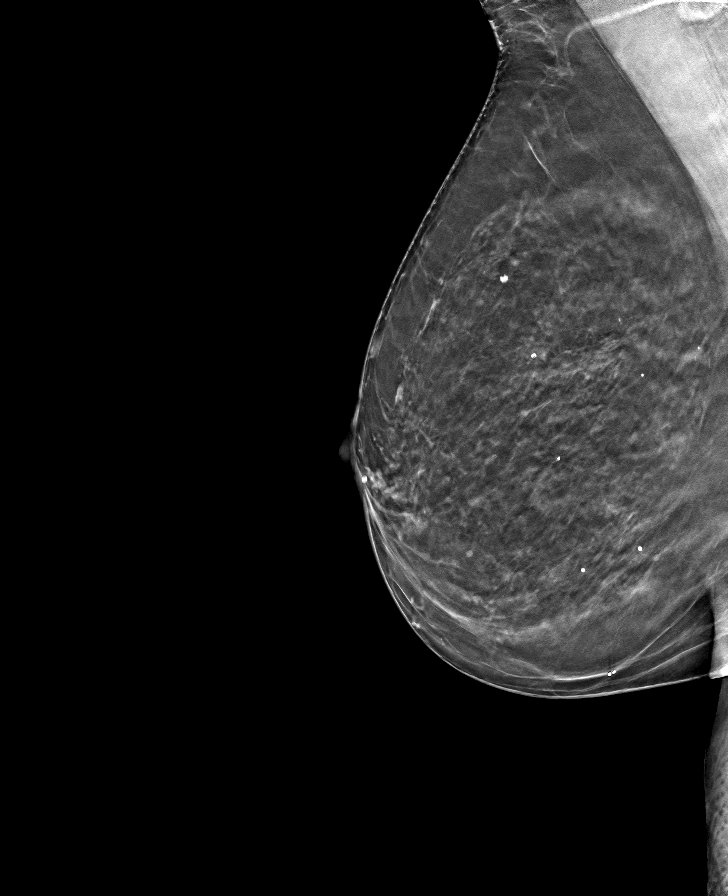

[L MLO tomo · tomo slice 34/67.0]
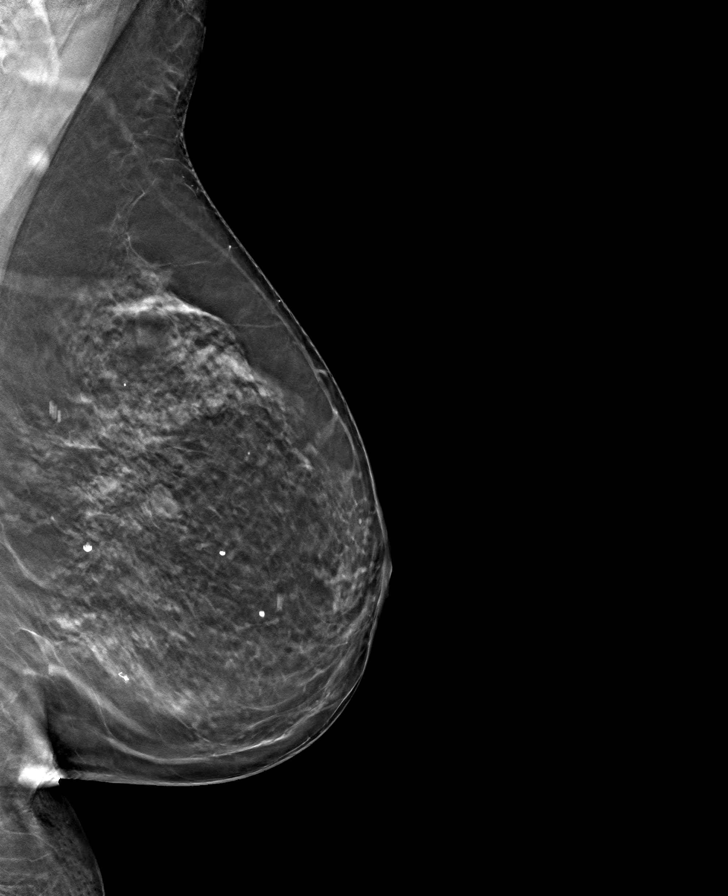

[R CC tomo · tomo slice 31/62.0]
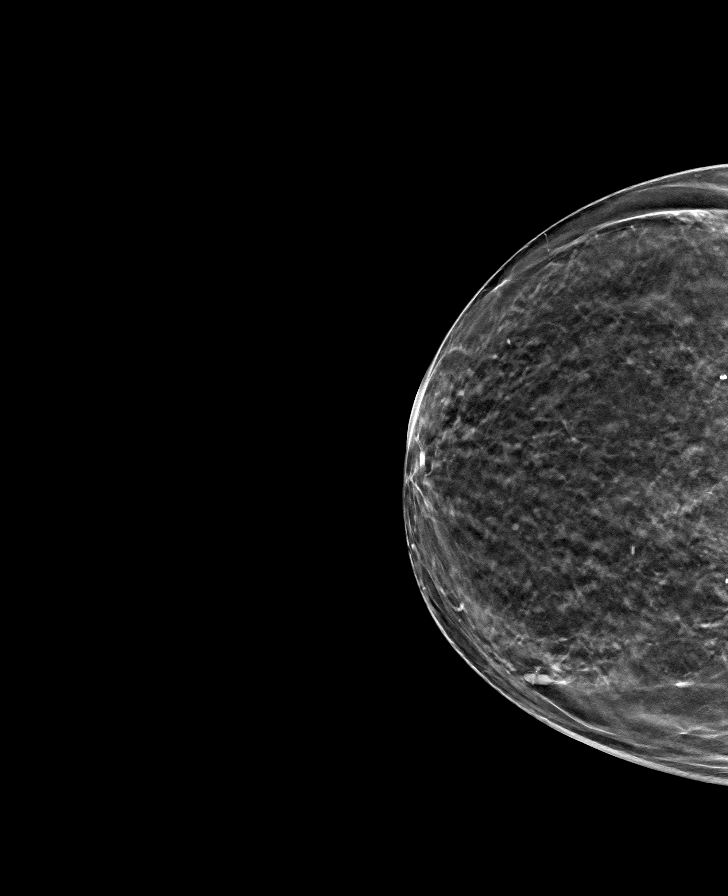

[L CC tomo · tomo slice 31/62.0]
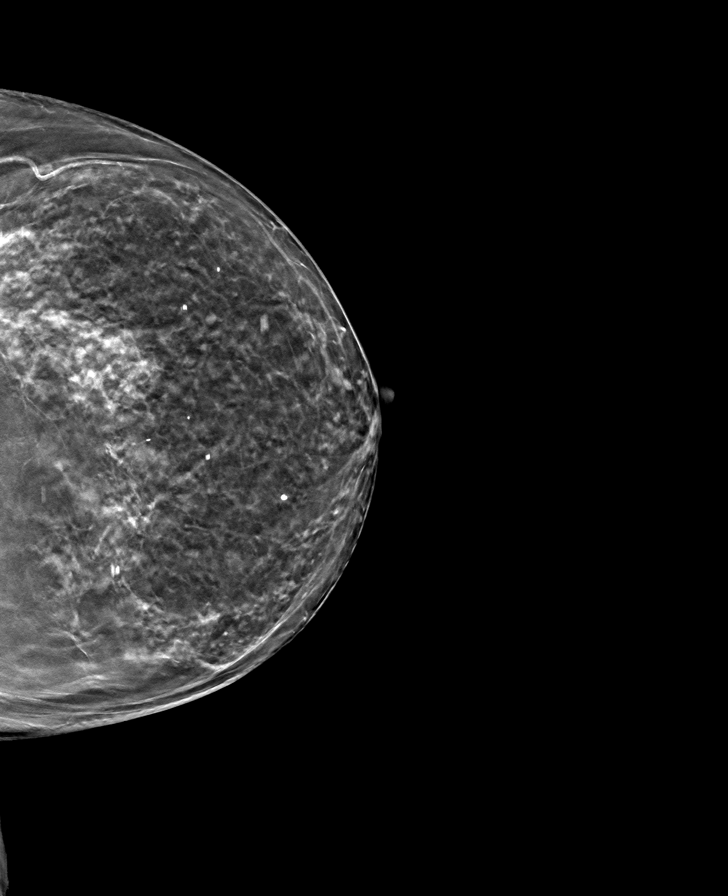

[8 of 24 positions shown; findings below may reference images not displayed]

ACR Breast Density Category b: There are scattered areas of
fibroglandular density.
FINDINGS: There are no findings suspicious for malignancy.
IMPRESSION: No mammographic evidence of malignancy. A result letter of this
screening mammogram will be mailed directly to the patient.

RECOMMENDATION:
Screening mammogram in one year. (Code:[id number])

BI-RADS CATEGORY  1: Negative.

## 2021-09-08 DIAGNOSIS — H401121 Primary open-angle glaucoma, left eye, mild stage: Secondary | ICD-10-CM | POA: Diagnosis not present

## 2021-09-08 DIAGNOSIS — H401113 Primary open-angle glaucoma, right eye, severe stage: Secondary | ICD-10-CM | POA: Diagnosis not present

## 2022-02-09 DIAGNOSIS — Z96651 Presence of right artificial knee joint: Secondary | ICD-10-CM | POA: Diagnosis not present

## 2022-03-01 DIAGNOSIS — E7800 Pure hypercholesterolemia, unspecified: Secondary | ICD-10-CM | POA: Diagnosis not present

## 2022-03-01 DIAGNOSIS — F17210 Nicotine dependence, cigarettes, uncomplicated: Secondary | ICD-10-CM | POA: Diagnosis not present

## 2022-03-01 DIAGNOSIS — E1169 Type 2 diabetes mellitus with other specified complication: Secondary | ICD-10-CM | POA: Diagnosis not present

## 2022-03-01 DIAGNOSIS — I10 Essential (primary) hypertension: Secondary | ICD-10-CM | POA: Diagnosis not present

## 2022-03-01 DIAGNOSIS — E039 Hypothyroidism, unspecified: Secondary | ICD-10-CM | POA: Diagnosis not present

## 2022-03-10 DIAGNOSIS — H26493 Other secondary cataract, bilateral: Secondary | ICD-10-CM | POA: Diagnosis not present

## 2022-03-10 DIAGNOSIS — H401113 Primary open-angle glaucoma, right eye, severe stage: Secondary | ICD-10-CM | POA: Diagnosis not present

## 2022-03-10 DIAGNOSIS — H04123 Dry eye syndrome of bilateral lacrimal glands: Secondary | ICD-10-CM | POA: Diagnosis not present

## 2022-03-10 DIAGNOSIS — H401121 Primary open-angle glaucoma, left eye, mild stage: Secondary | ICD-10-CM | POA: Diagnosis not present

## 2022-03-10 DIAGNOSIS — E119 Type 2 diabetes mellitus without complications: Secondary | ICD-10-CM | POA: Diagnosis not present

## 2022-03-10 DIAGNOSIS — Z961 Presence of intraocular lens: Secondary | ICD-10-CM | POA: Diagnosis not present

## 2022-03-31 DIAGNOSIS — H26492 Other secondary cataract, left eye: Secondary | ICD-10-CM | POA: Diagnosis not present

## 2022-04-29 ENCOUNTER — Other Ambulatory Visit: Payer: Self-pay | Admitting: Family Medicine

## 2022-04-29 DIAGNOSIS — Z1231 Encounter for screening mammogram for malignant neoplasm of breast: Secondary | ICD-10-CM

## 2022-05-13 DIAGNOSIS — E7800 Pure hypercholesterolemia, unspecified: Secondary | ICD-10-CM | POA: Diagnosis not present

## 2022-05-13 DIAGNOSIS — E1169 Type 2 diabetes mellitus with other specified complication: Secondary | ICD-10-CM | POA: Diagnosis not present

## 2022-05-13 DIAGNOSIS — I10 Essential (primary) hypertension: Secondary | ICD-10-CM | POA: Diagnosis not present

## 2022-05-13 DIAGNOSIS — E039 Hypothyroidism, unspecified: Secondary | ICD-10-CM | POA: Diagnosis not present

## 2022-06-14 ENCOUNTER — Ambulatory Visit
Admission: RE | Admit: 2022-06-14 | Discharge: 2022-06-14 | Disposition: A | Discharge status: Home / Self Care | Payer: Medicare Other | Source: Ambulatory Visit | Attending: Family Medicine | Admitting: Family Medicine

## 2022-06-14 DIAGNOSIS — Z1231 Encounter for screening mammogram for malignant neoplasm of breast: Secondary | ICD-10-CM | POA: Diagnosis not present

## 2022-06-15 ENCOUNTER — Other Ambulatory Visit: Payer: Self-pay | Admitting: Family Medicine

## 2022-06-15 DIAGNOSIS — R928 Other abnormal and inconclusive findings on diagnostic imaging of breast: Secondary | ICD-10-CM

## 2022-06-23 ENCOUNTER — Ambulatory Visit
Admission: RE | Admit: 2022-06-23 | Discharge: 2022-06-23 | Disposition: A | Discharge status: Home / Self Care | Payer: Medicare Other | Source: Ambulatory Visit | Attending: Family Medicine | Admitting: Family Medicine

## 2022-06-23 DIAGNOSIS — N6001 Solitary cyst of right breast: Secondary | ICD-10-CM | POA: Diagnosis not present

## 2022-06-23 DIAGNOSIS — R928 Other abnormal and inconclusive findings on diagnostic imaging of breast: Secondary | ICD-10-CM

## 2022-07-19 DIAGNOSIS — H6121 Impacted cerumen, right ear: Secondary | ICD-10-CM | POA: Diagnosis not present

## 2022-09-07 DIAGNOSIS — E039 Hypothyroidism, unspecified: Secondary | ICD-10-CM | POA: Diagnosis not present

## 2022-09-07 DIAGNOSIS — R531 Weakness: Secondary | ICD-10-CM | POA: Diagnosis not present

## 2022-09-07 DIAGNOSIS — J302 Other seasonal allergic rhinitis: Secondary | ICD-10-CM | POA: Diagnosis not present

## 2022-09-07 DIAGNOSIS — R0989 Other specified symptoms and signs involving the circulatory and respiratory systems: Secondary | ICD-10-CM | POA: Diagnosis not present

## 2022-09-07 DIAGNOSIS — K5900 Constipation, unspecified: Secondary | ICD-10-CM | POA: Diagnosis not present

## 2022-09-07 DIAGNOSIS — E1169 Type 2 diabetes mellitus with other specified complication: Secondary | ICD-10-CM | POA: Diagnosis not present

## 2022-09-07 DIAGNOSIS — U071 COVID-19: Secondary | ICD-10-CM | POA: Diagnosis not present

## 2022-09-07 DIAGNOSIS — J069 Acute upper respiratory infection, unspecified: Secondary | ICD-10-CM | POA: Diagnosis not present

## 2022-09-07 DIAGNOSIS — D509 Iron deficiency anemia, unspecified: Secondary | ICD-10-CM | POA: Diagnosis not present

## 2022-09-16 DIAGNOSIS — J309 Allergic rhinitis, unspecified: Secondary | ICD-10-CM | POA: Diagnosis not present

## 2022-09-16 DIAGNOSIS — U071 COVID-19: Secondary | ICD-10-CM | POA: Diagnosis not present

## 2022-09-27 DIAGNOSIS — E1169 Type 2 diabetes mellitus with other specified complication: Secondary | ICD-10-CM | POA: Diagnosis not present

## 2022-09-27 DIAGNOSIS — B3731 Acute candidiasis of vulva and vagina: Secondary | ICD-10-CM | POA: Diagnosis not present

## 2022-09-27 DIAGNOSIS — Z23 Encounter for immunization: Secondary | ICD-10-CM | POA: Diagnosis not present

## 2022-09-27 DIAGNOSIS — E039 Hypothyroidism, unspecified: Secondary | ICD-10-CM | POA: Diagnosis not present

## 2022-09-27 DIAGNOSIS — H401121 Primary open-angle glaucoma, left eye, mild stage: Secondary | ICD-10-CM | POA: Diagnosis not present

## 2022-09-27 DIAGNOSIS — E7800 Pure hypercholesterolemia, unspecified: Secondary | ICD-10-CM | POA: Diagnosis not present

## 2022-09-27 DIAGNOSIS — F17210 Nicotine dependence, cigarettes, uncomplicated: Secondary | ICD-10-CM | POA: Diagnosis not present

## 2022-09-27 DIAGNOSIS — J449 Chronic obstructive pulmonary disease, unspecified: Secondary | ICD-10-CM | POA: Diagnosis not present

## 2022-09-27 DIAGNOSIS — M8588 Other specified disorders of bone density and structure, other site: Secondary | ICD-10-CM | POA: Diagnosis not present

## 2022-09-27 DIAGNOSIS — I10 Essential (primary) hypertension: Secondary | ICD-10-CM | POA: Diagnosis not present

## 2022-09-27 DIAGNOSIS — Z0000 Encounter for general adult medical examination without abnormal findings: Secondary | ICD-10-CM | POA: Diagnosis not present

## 2022-09-29 ENCOUNTER — Other Ambulatory Visit: Payer: Self-pay | Admitting: Family Medicine

## 2022-09-29 DIAGNOSIS — M8580 Other specified disorders of bone density and structure, unspecified site: Secondary | ICD-10-CM

## 2022-10-07 DIAGNOSIS — Z96651 Presence of right artificial knee joint: Secondary | ICD-10-CM | POA: Diagnosis not present

## 2022-10-08 DIAGNOSIS — M25561 Pain in right knee: Secondary | ICD-10-CM | POA: Diagnosis not present

## 2022-10-14 DIAGNOSIS — E039 Hypothyroidism, unspecified: Secondary | ICD-10-CM | POA: Diagnosis not present

## 2022-10-18 DIAGNOSIS — E1169 Type 2 diabetes mellitus with other specified complication: Secondary | ICD-10-CM | POA: Diagnosis not present

## 2022-10-18 DIAGNOSIS — E7800 Pure hypercholesterolemia, unspecified: Secondary | ICD-10-CM | POA: Diagnosis not present

## 2022-10-18 DIAGNOSIS — I10 Essential (primary) hypertension: Secondary | ICD-10-CM | POA: Diagnosis not present

## 2022-11-04 DIAGNOSIS — H401113 Primary open-angle glaucoma, right eye, severe stage: Secondary | ICD-10-CM | POA: Diagnosis not present

## 2022-11-23 DIAGNOSIS — R21 Rash and other nonspecific skin eruption: Secondary | ICD-10-CM | POA: Diagnosis not present

## 2023-01-07 ENCOUNTER — Ambulatory Visit (HOSPITAL_COMMUNITY)
Admission: EM | Admit: 2023-01-07 | Discharge: 2023-01-07 | Disposition: A | Discharge status: Home / Self Care | Payer: Medicare HMO | Attending: Internal Medicine | Admitting: Internal Medicine

## 2023-01-07 ENCOUNTER — Ambulatory Visit (INDEPENDENT_AMBULATORY_CARE_PROVIDER_SITE_OTHER): Payer: Medicare HMO

## 2023-01-07 ENCOUNTER — Encounter: Payer: Self-pay | Admitting: Hematology

## 2023-01-07 ENCOUNTER — Encounter (HOSPITAL_COMMUNITY): Payer: Self-pay

## 2023-01-07 DIAGNOSIS — S29012A Strain of muscle and tendon of back wall of thorax, initial encounter: Secondary | ICD-10-CM

## 2023-01-07 DIAGNOSIS — R918 Other nonspecific abnormal finding of lung field: Secondary | ICD-10-CM | POA: Diagnosis not present

## 2023-01-07 DIAGNOSIS — R0782 Intercostal pain: Secondary | ICD-10-CM

## 2023-01-07 LAB — POCT URINALYSIS DIPSTICK, ED / UC
Bilirubin Urine: NEGATIVE
Glucose, UA: NEGATIVE mg/dL
Hgb urine dipstick: NEGATIVE
Ketones, ur: NEGATIVE mg/dL
Nitrite: NEGATIVE
Protein, ur: NEGATIVE mg/dL
Specific Gravity, Urine: 1.015 (ref 1.005–1.030)
Urobilinogen, UA: 0.2 mg/dL (ref 0.0–1.0)
pH: 6.5 (ref 5.0–8.0)

## 2023-01-07 MED ORDER — TIZANIDINE HCL 2 MG PO TABS: 4.0000 mg | ORAL_TABLET | Freq: Three times a day (TID) | ORAL | 0 refills | Status: AC | PRN

## 2023-01-07 NOTE — Discharge Instructions (Addendum)
Your pain is likely due to a muscle strain which will improve on its own with time. Chest x-ray was negative for acute broken bones.  Urinalysis negative for urinary tract infection.  - Take tylenol 1,'000mg'$  every 6 hours as needed with food for pain and inflammation.  - You may also take the prescribed muscle relaxer as directed as needed for muscle aches/spasm.  Do not take this medication and drive or drink alcohol as it can make you sleepy.  Mainly use this medicine at nighttime as needed. - Apply heat 20 minutes on then 20 minutes off and perform gentle range of motion exercises to the area of greatest pain to prevent muscle stiffness and provide further pain relief.   Red flag symptoms to watch out for are numbness/tingling to the legs, weakness, loss of bowel/bladder control, and/or worsening pain that does not respond well to medicines. Follow-up with your primary care provider or return to urgent care if your symptoms do not improve in the next 3 to 4 days with medications and interventions recommended today. If your symptoms are severe (red flag), please go to the emergency room.  I hope you feel better!

## 2023-01-07 NOTE — ED Triage Notes (Signed)
Patient's daughter reports that the patient was reaching up to get medication off of a shelf and thinks she pulled a muscle.   Tylenol 1000 mg at 1400 today.

## 2023-01-07 NOTE — ED Provider Notes (Addendum)
[facility]    CSN: [id number] Arrival date & time: 01/07/23  1223      History   Chief Complaint Chief Complaint  Patient presents with   Shoulder Pain    HPI Kelli Sandoval is a 77 y.o. female.   Patient presents to urgent care with her daughter who contributes to the history for evaluation of right upper back pain that started today after she was reaching upward with her right arm to grab a medication off of the top shelf.  She did not hear any pops or clicks to the upper back or rib cage.  Denies history of rib fractures and osteoporosis/osteopenia.  No recent falls, trauma, or injury to the upper back or chest wall.  She denies chest wall pain, denies numbness and tingling to the right upper extremity, neck pain, shortness of breath, abdominal pain, pain with deep inspiration, recent cough/viral URI symptoms, fever/chills, urinary symptoms, dizziness, and nausea, vomiting, and diarrhea.  She was treated for a urinary tract infection in October 2023 and did not finish the antibiotic due to side effect of diarrhea.  She is a type 2 diabetic but has been eating and drinking normally without signs of high or low blood sugar.  She currently rates her pain to the upper back near the right shoulder blade at an 8 on a scale of 0-10.  Pain is worse with movement of the right upper extremity at the shoulder joint and she describes the pain as a "pulling sensation".  She took 1,000 mg tylenol at 2pm shortly before arrival to urgent care for pain and states this helped with the pain.    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   Status post  total knee replacement, right 09/14/2019   Iron deficiency anemia 08/03/2017   GASTROENTERITIS 01/06/2011   Hyperlipidemia 11/10/2010   VITAMIN D DEFICIENCY 10/06/2009   VAGINITIS, CANDIDAL 10/03/2009   TRICHOMONAL VAGINITIS 10/03/2009   SHOULDER PAIN, RIGHT 06/16/2009   MUSCLE SPASM, TRAPEZIUS MUSCLE, RIGHT 04/28/2009   GASTROENTERITIS, HX OF 01/22/2009   ESSENTIAL HYPERTENSION, BENIGN 01/30/2008   DEGENERATIVE JOINT DISEASE, RIGHT KNEE 01/30/2008   OSTEOARTHRITIS, SHOULDER 08/15/2007   ALLERGIC RHINITIS 07/31/2007   BURSITIS NOS 07/31/2007   Hypothyroidism 02/02/2007   TOBACCO DEPENDENCE 02/02/2007   MIGRAINE, UNSPEC., W/O INTRACTABLE MIGRAINE 02/02/2007   GLAUCOMA 02/02/2007   CATARACT 02/02/2007   COPD 02/02/2007   BLOOD IN STOOL, MELENA 02/02/2007   INCONTINENCE, STRESS, FEMALE 02/02/2007   MENOPAUSAL SYNDROME 02/02/2007    Past Surgical History:  Procedure Laterality Date   allergic rhinitis     BREAST BIOPSY Left 2012   benign   COLONOSCOPY  10/13/2011   Procedure: COLONOSCOPY;  Surgeon: Missy Sabins, MD;  Location: [facility];  Service: Endoscopy;  Laterality: N/A;   LUNG SURGERY  25 years ago   benign tumor   TOTAL KNEE ARTHROPLASTY Right 09/14/2019   Procedure: TOTAL KNEE ARTHROPLASTY;  Surgeon: Netta Cedars, MD;  Location: [facility];  Service: Orthopedics;  Laterality: Right;    OB History   No obstetric history on file.      Home  Medications    Prior to Admission medications   Medication Sig Start Date End Date Taking? Authorizing Provider  tiZANidine (ZANAFLEX) 2 MG tablet Take 2 tablets (4 mg total) by mouth every 8 (eight) hours as needed for muscle spasms. 01/07/23  Yes Talbot Grumbling, FNP  acetaminophen (TYLENOL) 650 MG CR tablet Take 650-1,300 mg by mouth every 8 (eight) hours as needed (shoulder pain/pain.).    [provider]  Ascorbic Acid (VITAMIN C GUMMIES PO) Take 2 tablets by mouth daily.    [provider]  aspirin EC  81 MG tablet Take 1 tablet (81 mg total) by mouth 2 (two) times daily. 09/14/19   Netta Cedars, MD  cholecalciferol (VITAMIN D) 25 MCG (1000 UT) tablet Take 1,000 Units by mouth daily.    [provider]  dorzolamide-timolol (COSOPT) 22.3-6.8 MG/ML ophthalmic solution Place 1 drop into both eyes 2 (two) times daily. 12/25/18   [provider]  fish oil-omega-3 fatty acids 1000 MG capsule Take 1 g by mouth 2 (two) times daily.     [provider]  hydrochlorothiazide (HYDRODIURIL) 25 MG tablet Take 25 mg by mouth daily. 12/07/18   [provider]  latanoprost (XALATAN) 0.005 % ophthalmic solution Place 1 drop into both eyes at bedtime. 11/10/18   [provider]  levothyroxine (SYNTHROID, LEVOTHROID) 88 MCG tablet Take 88 mcg by mouth daily. 01/09/19   [provider]  loratadine (CLARITIN) 10 MG tablet Take 10 mg by mouth daily.    [provider]  metFORMIN (GLUCOPHAGE-XR) 500 MG 24 hr tablet Take 500 mg by mouth 3 (three) times daily after meals.    [provider]  simvastatin (ZOCOR) 20 MG tablet Take 20 mg by mouth daily.  03/11/19   [provider]    Family History Family History  Problem Relation Age of Onset   Breast cancer Sister     Social History Social History   Tobacco Use   Smoking status: Some Days    Types: Cigarettes   Smokeless tobacco: Never   Tobacco comments:    smokes 1 cigaratte "everyone once in a while"   Vaping Use   Vaping Use: Never used  Substance Use Topics   Alcohol use: No   Drug use: No     Allergies   Patient has no known allergies.   Review of Systems Review of Systems Per HPI  Physical Exam Triage Vital Signs ED Triage Vitals  Enc Vitals Group     BP 01/07/23 1434 127/68     Pulse Rate 01/07/23 1434 67     Resp 01/07/23 1434 16     Temp 01/07/23 1434 98.5 F (36.9 C)     Temp Source 01/07/23 1434 Oral     SpO2 01/07/23 1434 95 %     Weight --      Height  --      Head Circumference --      Peak Flow --      Pain Score 01/07/23 1436 9     Pain Loc --      Pain Edu? --      Excl. in [place]? --    No data found.  Updated Vital Signs BP 127/68 (BP Location: Left Arm)   Pulse 67   Temp 98.5 F (36.9 C) (Oral)   Resp 16   SpO2 95%   Visual Acuity Right Eye Distance:   Left Eye Distance:   Bilateral Distance:  Right Eye Near:   Left Eye Near:    Bilateral Near:     Physical Exam Vitals and nursing note reviewed.  Constitutional:      Appearance: She is not ill-appearing or toxic-appearing.  HENT:     Head: Normocephalic and atraumatic.     Right Ear: Hearing, tympanic membrane, ear canal and external ear normal.     Left Ear: Hearing, tympanic membrane, ear canal and external ear normal.     Nose: Nose normal.     Mouth/Throat:     Lips: Pink.     Mouth: Mucous membranes are moist.     Pharynx: No posterior oropharyngeal erythema.  Eyes:     General: Lids are normal. Vision grossly intact. Gaze aligned appropriately.        Right eye: No discharge.        Left eye: No discharge.     Extraocular Movements: Extraocular movements intact.     Conjunctiva/sclera: Conjunctivae normal.     Pupils: Pupils are equal, round, and reactive to light.  Cardiovascular:     Rate and Rhythm: Normal rate and regular rhythm.     Heart sounds: Normal heart sounds, S1 normal and S2 normal.  Pulmonary:     Effort: Pulmonary effort is normal. No respiratory distress.     Breath sounds: Normal breath sounds and air entry.  Abdominal:     General: Abdomen is flat. Bowel sounds are normal.     Palpations: Abdomen is soft.     Tenderness: There is no abdominal tenderness. There is no right CVA tenderness, left CVA tenderness or guarding.  Musculoskeletal:     Cervical back: Normal and neck supple.     Thoracic back: Tenderness present. No bony tenderness. Normal range of motion.     Lumbar back: Normal.       Back:     Right lower leg: No  edema.     Left lower leg: No edema.     Comments: No midline tenderness to thoracic spine.  TTP to the right thoracic paraspinals as well as the right posterior ribcage.  No evidence of injury, trauma, swelling, laceration, spasms, or ecchymosis.  Point tender to area indicated above in image.  Very tender to right thoracic paraspinals with active range of motion of the right upper extremity at the shoulder joint.   Lymphadenopathy:     Cervical: No cervical adenopathy.  Skin:    General: Skin is warm and dry.     Capillary Refill: Capillary refill takes less than 2 seconds.     Findings: No rash.  Neurological:     General: No focal deficit present.     Mental Status: She is alert and oriented to person, place, and time. Mental status is at baseline.     Cranial Nerves: No dysarthria or facial asymmetry.     Comments: Non-focal neuro exam. Sensation and strength intact to all 4 extremities. Moves all 4 extremities with normal coordination voluntarily.   Psychiatric:        Mood and Affect: Mood normal.        Speech: Speech normal.        Behavior: Behavior normal.        Thought Content: Thought content normal.        Judgment: Judgment normal.      UC Treatments / Results  Labs (all labs ordered are listed, but only abnormal results are displayed) Labs Reviewed  POCT URINALYSIS DIPSTICK, ED /  UC - Abnormal; Notable for the following components:      Result Value   Leukocytes,Ua TRACE (*)    All other components within normal limits    EKG   Radiology DG Ribs Unilateral W/Chest Right  Result Date: 01/07/2023 CLINICAL DATA:  Patient's daughter reports that the patient was reaching up to get medication off of a shelf and thinks she pulled a muscle. EXAM: RIGHT RIBS AND CHEST - 3+ VIEW COMPARISON:  None Available. FINDINGS: No pleural effusion. No pneumothorax. Hazy opacity at the right lung base is nonspecific and may represent infection or scarring. Chronic posterior right  sixth and seventh rib fractures. Normal cardiac contours. Surgical clips in the right upper quadrant. IMPRESSION: 1. Hazy opacity at the right lung base is nonspecific and may represent infection or scarring. 2. Chronic posterior right 6th and 7th rib fractures. No definite radiographic evidence of an acute displaced right sided rib fracture. Electronically Signed   By: Marin Roberts M.D.   On: 01/07/2023 15:49    Procedures Procedures (including critical care time)  Medications Ordered in UC Medications - No data to display  Initial Impression / Assessment and Plan / UC Course  I have reviewed the triage vital signs and the nursing notes.  Pertinent labs & imaging results that were available during my care of the patient were reviewed by me and considered in my medical decision making (see chart for details).   1.  Strain of thoracic back region Chest x-ray is negative for acute finding.  Shows hazy patch to the right lower lobe consistent with either scarring or infection.  Patient is not having any fever, chills, cough, or signs of infection, therefore we will defer antibiotics at this time.  Low suspicion for pneumonia.  Shows chronic 6th and 7th rib fractures.  Urinalysis is negative for signs of urinary tract infection.  Leukocytes on urinalysis likely indicative of chronic colonization of bacteria to the bladder without active infection.    Presentation is consistent with acute muscle strain of the thoracic spine. Will treat with zanaflex muscle relaxer '2mg'$  every 8 hours as needed for muscle spasm. Tylenol may be continued every 6 hours as needed for pain. Drowsiness precautions discussed regarding Zanaflex prescription.  20 minutes on 20 minutes off heat for the next few days will help to relax muscles in the area.  Walking referral to [facility] orthopedics provided should symptoms fail to improve in the next 1 to 2 weeks with conservative treatment.   Discussed physical exam and  available lab work findings in clinic with patient.  Counseled patient regarding appropriate use of medications and potential side effects for all medications recommended or prescribed today. Discussed red flag signs and symptoms of worsening condition,when to call the PCP office, return to urgent care, and when to seek higher level of care in the emergency department. Patient verbalizes understanding and agreement with plan. All questions answered. Patient discharged in stable condition.   Final Clinical Impressions(s) / UC Diagnoses   Final diagnoses:  Strain of thoracic back region     Discharge Instructions      Your pain is likely due to a muscle strain which will improve on its own with time. Chest x-ray was negative for acute broken bones.  Urinalysis negative for urinary tract infection.  - Take tylenol 1,'000mg'$  every 6 hours as needed with food for pain and inflammation.  - You may also take the prescribed muscle relaxer as directed as needed for muscle  aches/spasm.  Do not take this medication and drive or drink alcohol as it can make you sleepy.  Mainly use this medicine at nighttime as needed. - Apply heat 20 minutes on then 20 minutes off and perform gentle range of motion exercises to the area of greatest pain to prevent muscle stiffness and provide further pain relief.   Red flag symptoms to watch out for are numbness/tingling to the legs, weakness, loss of bowel/bladder control, and/or worsening pain that does not respond well to medicines. Follow-up with your primary care provider or return to urgent care if your symptoms do not improve in the next 3 to 4 days with medications and interventions recommended today. If your symptoms are severe (red flag), please go to the emergency room.  I hope you feel better!      ED Prescriptions     Medication Sig Dispense Auth. Provider   tiZANidine (ZANAFLEX) 2 MG tablet Take 2 tablets (4 mg total) by mouth every 8 (eight) hours as  needed for muscle spasms. 10 tablet Talbot Grumbling, FNP      PDMP not reviewed this encounter.   Talbot Grumbling, FNP 01/07/23 [place], [place], FNP 01/07/23 1627

## 2023-01-10 DIAGNOSIS — S29012A Strain of muscle and tendon of back wall of thorax, initial encounter: Secondary | ICD-10-CM | POA: Diagnosis not present

## 2023-03-11 ENCOUNTER — Encounter: Payer: Self-pay | Admitting: Hematology

## 2023-03-18 ENCOUNTER — Ambulatory Visit
Admission: RE | Admit: 2023-03-18 | Discharge: 2023-03-18 | Disposition: A | Discharge status: Home / Self Care | Payer: 59 | Source: Ambulatory Visit | Attending: Family Medicine | Admitting: Family Medicine

## 2023-03-18 DIAGNOSIS — Z780 Asymptomatic menopausal state: Secondary | ICD-10-CM | POA: Diagnosis not present

## 2023-03-18 DIAGNOSIS — M8589 Other specified disorders of bone density and structure, multiple sites: Secondary | ICD-10-CM | POA: Diagnosis not present

## 2023-03-18 DIAGNOSIS — M8580 Other specified disorders of bone density and structure, unspecified site: Secondary | ICD-10-CM

## 2023-03-29 DIAGNOSIS — I10 Essential (primary) hypertension: Secondary | ICD-10-CM | POA: Diagnosis not present

## 2023-03-29 DIAGNOSIS — J449 Chronic obstructive pulmonary disease, unspecified: Secondary | ICD-10-CM | POA: Diagnosis not present

## 2023-03-29 DIAGNOSIS — E7800 Pure hypercholesterolemia, unspecified: Secondary | ICD-10-CM | POA: Diagnosis not present

## 2023-03-29 DIAGNOSIS — E1169 Type 2 diabetes mellitus with other specified complication: Secondary | ICD-10-CM | POA: Diagnosis not present

## 2023-03-29 DIAGNOSIS — E039 Hypothyroidism, unspecified: Secondary | ICD-10-CM | POA: Diagnosis not present

## 2023-03-29 DIAGNOSIS — F17210 Nicotine dependence, cigarettes, uncomplicated: Secondary | ICD-10-CM | POA: Diagnosis not present

## 2023-03-29 DIAGNOSIS — E119 Type 2 diabetes mellitus without complications: Secondary | ICD-10-CM | POA: Diagnosis not present

## 2023-05-18 DIAGNOSIS — H04123 Dry eye syndrome of bilateral lacrimal glands: Secondary | ICD-10-CM | POA: Diagnosis not present

## 2023-05-18 DIAGNOSIS — Z961 Presence of intraocular lens: Secondary | ICD-10-CM | POA: Diagnosis not present

## 2023-05-18 DIAGNOSIS — H401113 Primary open-angle glaucoma, right eye, severe stage: Secondary | ICD-10-CM | POA: Diagnosis not present

## 2023-05-18 DIAGNOSIS — E119 Type 2 diabetes mellitus without complications: Secondary | ICD-10-CM | POA: Diagnosis not present

## 2023-05-19 ENCOUNTER — Other Ambulatory Visit: Payer: Self-pay | Admitting: Family Medicine

## 2023-05-19 DIAGNOSIS — Z1231 Encounter for screening mammogram for malignant neoplasm of breast: Secondary | ICD-10-CM

## 2023-06-09 ENCOUNTER — Encounter: Payer: Self-pay | Admitting: Hematology

## 2023-06-16 ENCOUNTER — Ambulatory Visit
Admission: RE | Admit: 2023-06-16 | Discharge: 2023-06-16 | Disposition: A | Discharge status: Home / Self Care | Payer: 59 | Source: Ambulatory Visit | Attending: Family Medicine | Admitting: Family Medicine

## 2023-06-16 DIAGNOSIS — Z1231 Encounter for screening mammogram for malignant neoplasm of breast: Secondary | ICD-10-CM

## 2023-09-16 DIAGNOSIS — J302 Other seasonal allergic rhinitis: Secondary | ICD-10-CM | POA: Diagnosis not present

## 2023-10-19 DIAGNOSIS — H401121 Primary open-angle glaucoma, left eye, mild stage: Secondary | ICD-10-CM | POA: Diagnosis not present

## 2023-10-19 DIAGNOSIS — Z9181 History of falling: Secondary | ICD-10-CM | POA: Diagnosis not present

## 2023-10-19 DIAGNOSIS — F17210 Nicotine dependence, cigarettes, uncomplicated: Secondary | ICD-10-CM | POA: Diagnosis not present

## 2023-10-19 DIAGNOSIS — E119 Type 2 diabetes mellitus without complications: Secondary | ICD-10-CM | POA: Diagnosis not present

## 2023-10-19 DIAGNOSIS — M8588 Other specified disorders of bone density and structure, other site: Secondary | ICD-10-CM | POA: Diagnosis not present

## 2023-10-19 DIAGNOSIS — Z0000 Encounter for general adult medical examination without abnormal findings: Secondary | ICD-10-CM | POA: Diagnosis not present

## 2023-10-19 DIAGNOSIS — Z23 Encounter for immunization: Secondary | ICD-10-CM | POA: Diagnosis not present

## 2023-10-19 DIAGNOSIS — E7800 Pure hypercholesterolemia, unspecified: Secondary | ICD-10-CM | POA: Diagnosis not present

## 2023-10-19 DIAGNOSIS — I10 Essential (primary) hypertension: Secondary | ICD-10-CM | POA: Diagnosis not present

## 2023-10-19 DIAGNOSIS — J449 Chronic obstructive pulmonary disease, unspecified: Secondary | ICD-10-CM | POA: Diagnosis not present

## 2023-10-19 DIAGNOSIS — E039 Hypothyroidism, unspecified: Secondary | ICD-10-CM | POA: Diagnosis not present

## 2023-11-15 DIAGNOSIS — H401113 Primary open-angle glaucoma, right eye, severe stage: Secondary | ICD-10-CM | POA: Diagnosis not present

## 2024-04-09 DIAGNOSIS — E1165 Type 2 diabetes mellitus with hyperglycemia: Secondary | ICD-10-CM | POA: Diagnosis not present

## 2024-04-09 DIAGNOSIS — E039 Hypothyroidism, unspecified: Secondary | ICD-10-CM | POA: Diagnosis not present

## 2024-04-09 DIAGNOSIS — R200 Anesthesia of skin: Secondary | ICD-10-CM | POA: Diagnosis not present

## 2024-04-09 DIAGNOSIS — D509 Iron deficiency anemia, unspecified: Secondary | ICD-10-CM | POA: Diagnosis not present

## 2024-05-07 DIAGNOSIS — E1169 Type 2 diabetes mellitus with other specified complication: Secondary | ICD-10-CM | POA: Diagnosis not present

## 2024-05-07 DIAGNOSIS — I10 Essential (primary) hypertension: Secondary | ICD-10-CM | POA: Diagnosis not present

## 2024-05-07 DIAGNOSIS — J449 Chronic obstructive pulmonary disease, unspecified: Secondary | ICD-10-CM | POA: Diagnosis not present

## 2024-05-07 DIAGNOSIS — E039 Hypothyroidism, unspecified: Secondary | ICD-10-CM | POA: Diagnosis not present

## 2024-05-07 DIAGNOSIS — E7800 Pure hypercholesterolemia, unspecified: Secondary | ICD-10-CM | POA: Diagnosis not present

## 2024-05-07 DIAGNOSIS — F17210 Nicotine dependence, cigarettes, uncomplicated: Secondary | ICD-10-CM | POA: Diagnosis not present

## 2024-05-10 ENCOUNTER — Other Ambulatory Visit: Payer: Self-pay | Admitting: Family Medicine

## 2024-05-10 DIAGNOSIS — Z1231 Encounter for screening mammogram for malignant neoplasm of breast: Secondary | ICD-10-CM

## 2024-06-04 DIAGNOSIS — E039 Hypothyroidism, unspecified: Secondary | ICD-10-CM | POA: Diagnosis not present

## 2024-06-04 DIAGNOSIS — E1169 Type 2 diabetes mellitus with other specified complication: Secondary | ICD-10-CM | POA: Diagnosis not present

## 2024-06-04 DIAGNOSIS — H401121 Primary open-angle glaucoma, left eye, mild stage: Secondary | ICD-10-CM | POA: Diagnosis not present

## 2024-06-04 DIAGNOSIS — E7800 Pure hypercholesterolemia, unspecified: Secondary | ICD-10-CM | POA: Diagnosis not present

## 2024-06-05 DIAGNOSIS — J449 Chronic obstructive pulmonary disease, unspecified: Secondary | ICD-10-CM | POA: Diagnosis not present

## 2024-06-05 DIAGNOSIS — I10 Essential (primary) hypertension: Secondary | ICD-10-CM | POA: Diagnosis not present

## 2024-06-05 DIAGNOSIS — E1169 Type 2 diabetes mellitus with other specified complication: Secondary | ICD-10-CM | POA: Diagnosis not present

## 2024-06-05 DIAGNOSIS — E119 Type 2 diabetes mellitus without complications: Secondary | ICD-10-CM | POA: Diagnosis not present

## 2024-06-18 ENCOUNTER — Ambulatory Visit

## 2024-06-29 DIAGNOSIS — Z961 Presence of intraocular lens: Secondary | ICD-10-CM | POA: Diagnosis not present

## 2024-06-29 DIAGNOSIS — E119 Type 2 diabetes mellitus without complications: Secondary | ICD-10-CM | POA: Diagnosis not present

## 2024-06-29 DIAGNOSIS — H401113 Primary open-angle glaucoma, right eye, severe stage: Secondary | ICD-10-CM | POA: Diagnosis not present

## 2024-06-29 DIAGNOSIS — H04123 Dry eye syndrome of bilateral lacrimal glands: Secondary | ICD-10-CM | POA: Diagnosis not present

## 2024-07-04 ENCOUNTER — Ambulatory Visit
Admission: RE | Admit: 2024-07-04 | Discharge: 2024-07-04 | Disposition: A | Discharge status: Home / Self Care | Source: Ambulatory Visit | Attending: Family Medicine | Admitting: Family Medicine

## 2024-07-04 DIAGNOSIS — E1169 Type 2 diabetes mellitus with other specified complication: Secondary | ICD-10-CM | POA: Diagnosis not present

## 2024-07-04 DIAGNOSIS — Z1231 Encounter for screening mammogram for malignant neoplasm of breast: Secondary | ICD-10-CM

## 2024-07-04 DIAGNOSIS — J449 Chronic obstructive pulmonary disease, unspecified: Secondary | ICD-10-CM | POA: Diagnosis not present

## 2024-07-04 DIAGNOSIS — I10 Essential (primary) hypertension: Secondary | ICD-10-CM | POA: Diagnosis not present

## 2024-07-04 DIAGNOSIS — E119 Type 2 diabetes mellitus without complications: Secondary | ICD-10-CM | POA: Diagnosis not present

## 2024-07-05 ENCOUNTER — Ambulatory Visit

## 2024-07-05 DIAGNOSIS — I10 Essential (primary) hypertension: Secondary | ICD-10-CM | POA: Diagnosis not present

## 2024-07-05 DIAGNOSIS — J449 Chronic obstructive pulmonary disease, unspecified: Secondary | ICD-10-CM | POA: Diagnosis not present

## 2024-07-05 DIAGNOSIS — E7800 Pure hypercholesterolemia, unspecified: Secondary | ICD-10-CM | POA: Diagnosis not present

## 2024-07-05 DIAGNOSIS — E119 Type 2 diabetes mellitus without complications: Secondary | ICD-10-CM | POA: Diagnosis not present

## 2024-07-05 DIAGNOSIS — H401121 Primary open-angle glaucoma, left eye, mild stage: Secondary | ICD-10-CM | POA: Diagnosis not present

## 2024-07-05 DIAGNOSIS — E1169 Type 2 diabetes mellitus with other specified complication: Secondary | ICD-10-CM | POA: Diagnosis not present

## 2024-07-05 DIAGNOSIS — E039 Hypothyroidism, unspecified: Secondary | ICD-10-CM | POA: Diagnosis not present

## 2024-08-03 DIAGNOSIS — J449 Chronic obstructive pulmonary disease, unspecified: Secondary | ICD-10-CM | POA: Diagnosis not present

## 2024-08-03 DIAGNOSIS — I10 Essential (primary) hypertension: Secondary | ICD-10-CM | POA: Diagnosis not present

## 2024-08-03 DIAGNOSIS — E119 Type 2 diabetes mellitus without complications: Secondary | ICD-10-CM | POA: Diagnosis not present

## 2024-08-03 DIAGNOSIS — E1169 Type 2 diabetes mellitus with other specified complication: Secondary | ICD-10-CM | POA: Diagnosis not present

## 2024-08-05 DIAGNOSIS — E7800 Pure hypercholesterolemia, unspecified: Secondary | ICD-10-CM | POA: Diagnosis not present

## 2024-08-05 DIAGNOSIS — E1169 Type 2 diabetes mellitus with other specified complication: Secondary | ICD-10-CM | POA: Diagnosis not present

## 2024-08-05 DIAGNOSIS — E039 Hypothyroidism, unspecified: Secondary | ICD-10-CM | POA: Diagnosis not present

## 2024-08-05 DIAGNOSIS — H401121 Primary open-angle glaucoma, left eye, mild stage: Secondary | ICD-10-CM | POA: Diagnosis not present

## 2024-09-02 DIAGNOSIS — I10 Essential (primary) hypertension: Secondary | ICD-10-CM | POA: Diagnosis not present

## 2024-09-02 DIAGNOSIS — J449 Chronic obstructive pulmonary disease, unspecified: Secondary | ICD-10-CM | POA: Diagnosis not present

## 2024-09-02 DIAGNOSIS — E1169 Type 2 diabetes mellitus with other specified complication: Secondary | ICD-10-CM | POA: Diagnosis not present

## 2024-09-02 DIAGNOSIS — E119 Type 2 diabetes mellitus without complications: Secondary | ICD-10-CM | POA: Diagnosis not present

## 2024-09-04 DIAGNOSIS — E1169 Type 2 diabetes mellitus with other specified complication: Secondary | ICD-10-CM | POA: Diagnosis not present

## 2024-09-04 DIAGNOSIS — E039 Hypothyroidism, unspecified: Secondary | ICD-10-CM | POA: Diagnosis not present

## 2024-09-04 DIAGNOSIS — H401121 Primary open-angle glaucoma, left eye, mild stage: Secondary | ICD-10-CM | POA: Diagnosis not present

## 2024-09-04 DIAGNOSIS — E7800 Pure hypercholesterolemia, unspecified: Secondary | ICD-10-CM | POA: Diagnosis not present

## 2024-09-04 DIAGNOSIS — J449 Chronic obstructive pulmonary disease, unspecified: Secondary | ICD-10-CM | POA: Diagnosis not present

## 2024-09-04 DIAGNOSIS — I10 Essential (primary) hypertension: Secondary | ICD-10-CM | POA: Diagnosis not present

## 2024-09-04 DIAGNOSIS — E119 Type 2 diabetes mellitus without complications: Secondary | ICD-10-CM | POA: Diagnosis not present

## 2024-10-02 DIAGNOSIS — J449 Chronic obstructive pulmonary disease, unspecified: Secondary | ICD-10-CM | POA: Diagnosis not present

## 2024-10-02 DIAGNOSIS — E1169 Type 2 diabetes mellitus with other specified complication: Secondary | ICD-10-CM | POA: Diagnosis not present

## 2024-10-02 DIAGNOSIS — E119 Type 2 diabetes mellitus without complications: Secondary | ICD-10-CM | POA: Diagnosis not present

## 2024-10-02 DIAGNOSIS — I10 Essential (primary) hypertension: Secondary | ICD-10-CM | POA: Diagnosis not present

## 2024-10-05 DIAGNOSIS — E1169 Type 2 diabetes mellitus with other specified complication: Secondary | ICD-10-CM | POA: Diagnosis not present

## 2024-10-05 DIAGNOSIS — H401121 Primary open-angle glaucoma, left eye, mild stage: Secondary | ICD-10-CM | POA: Diagnosis not present

## 2024-10-05 DIAGNOSIS — I10 Essential (primary) hypertension: Secondary | ICD-10-CM | POA: Diagnosis not present

## 2024-10-05 DIAGNOSIS — J449 Chronic obstructive pulmonary disease, unspecified: Secondary | ICD-10-CM | POA: Diagnosis not present

## 2024-10-05 DIAGNOSIS — E7800 Pure hypercholesterolemia, unspecified: Secondary | ICD-10-CM | POA: Diagnosis not present

## 2024-10-05 DIAGNOSIS — E039 Hypothyroidism, unspecified: Secondary | ICD-10-CM | POA: Diagnosis not present

## 2024-10-05 DIAGNOSIS — E119 Type 2 diabetes mellitus without complications: Secondary | ICD-10-CM | POA: Diagnosis not present
